# Patient Record
Sex: Female | Born: 1955 | Race: White | Hispanic: No | Marital: Married | Smoking: Never smoker
Health system: Southern US, Community
[De-identification: ages and names within clinical notes are randomized; demographics above are authoritative.]

## PROBLEM LIST (undated history)

## (undated) DIAGNOSIS — Z972 Presence of dental prosthetic device (complete) (partial): Secondary | ICD-10-CM

## (undated) DIAGNOSIS — I4510 Unspecified right bundle-branch block: Secondary | ICD-10-CM

## (undated) DIAGNOSIS — Z853 Personal history of malignant neoplasm of breast: Secondary | ICD-10-CM

## (undated) DIAGNOSIS — E039 Hypothyroidism, unspecified: Secondary | ICD-10-CM

## (undated) DIAGNOSIS — S63056A Dislocation of other carpometacarpal joint of unspecified hand, initial encounter: Secondary | ICD-10-CM

## (undated) HISTORY — PX: BREAST LUMPECTOMY: SHX2

## (undated) HISTORY — PX: CATARACT EXTRACTION W/ INTRAOCULAR LENS  IMPLANT, BILATERAL: SHX1307

## (undated) HISTORY — PX: PTOSIS REPAIR: SHX6568

---

## 2005-03-27 ENCOUNTER — Other Ambulatory Visit: Admission: RE | Admit: 2005-03-27 | Discharge: 2005-03-27 | Payer: Self-pay | Admitting: Family Medicine

## 2015-10-21 DIAGNOSIS — Z170 Estrogen receptor positive status [ER+]: Secondary | ICD-10-CM

## 2015-10-21 DIAGNOSIS — C50411 Malignant neoplasm of upper-outer quadrant of right female breast: Secondary | ICD-10-CM | POA: Insufficient documentation

## 2015-10-21 DIAGNOSIS — Z79810 Long term (current) use of selective estrogen receptor modulators (SERMs): Secondary | ICD-10-CM

## 2015-10-21 DIAGNOSIS — Z5181 Encounter for therapeutic drug level monitoring: Secondary | ICD-10-CM | POA: Insufficient documentation

## 2016-05-06 ENCOUNTER — Emergency Department (HOSPITAL_BASED_OUTPATIENT_CLINIC_OR_DEPARTMENT_OTHER): Payer: BC Managed Care – PPO

## 2016-05-06 ENCOUNTER — Encounter (HOSPITAL_BASED_OUTPATIENT_CLINIC_OR_DEPARTMENT_OTHER): Payer: Self-pay | Admitting: *Deleted

## 2016-05-06 ENCOUNTER — Emergency Department (HOSPITAL_BASED_OUTPATIENT_CLINIC_OR_DEPARTMENT_OTHER)
Admission: EM | Admit: 2016-05-06 | Discharge: 2016-05-06 | Disposition: A | Discharge status: Home / Self Care | Payer: BC Managed Care – PPO | Attending: Emergency Medicine | Admitting: Emergency Medicine

## 2016-05-06 DIAGNOSIS — Y929 Unspecified place or not applicable: Secondary | ICD-10-CM | POA: Insufficient documentation

## 2016-05-06 DIAGNOSIS — Y9301 Activity, walking, marching and hiking: Secondary | ICD-10-CM | POA: Insufficient documentation

## 2016-05-06 DIAGNOSIS — W1839XA Other fall on same level, initial encounter: Secondary | ICD-10-CM | POA: Diagnosis not present

## 2016-05-06 DIAGNOSIS — Y999 Unspecified external cause status: Secondary | ICD-10-CM | POA: Diagnosis not present

## 2016-05-06 DIAGNOSIS — Z79899 Other long term (current) drug therapy: Secondary | ICD-10-CM | POA: Diagnosis not present

## 2016-05-06 DIAGNOSIS — Z7982 Long term (current) use of aspirin: Secondary | ICD-10-CM | POA: Diagnosis not present

## 2016-05-06 DIAGNOSIS — S60221A Contusion of right hand, initial encounter: Secondary | ICD-10-CM | POA: Diagnosis not present

## 2016-05-06 DIAGNOSIS — W19XXXA Unspecified fall, initial encounter: Secondary | ICD-10-CM

## 2016-05-06 DIAGNOSIS — Z23 Encounter for immunization: Secondary | ICD-10-CM | POA: Insufficient documentation

## 2016-05-06 DIAGNOSIS — S8992XA Unspecified injury of left lower leg, initial encounter: Secondary | ICD-10-CM | POA: Diagnosis present

## 2016-05-06 DIAGNOSIS — S82832A Other fracture of upper and lower end of left fibula, initial encounter for closed fracture: Secondary | ICD-10-CM

## 2016-05-06 DIAGNOSIS — S0181XA Laceration without foreign body of other part of head, initial encounter: Secondary | ICD-10-CM | POA: Diagnosis not present

## 2016-05-06 MED ORDER — LIDOCAINE-EPINEPHRINE-TETRACAINE (LET) SOLUTION
3.0000 mL | Freq: Once | NASAL | Status: AC
  Administered 2016-05-06: 3 mL via TOPICAL
  Filled 2016-05-06: qty 3

## 2016-05-06 MED ORDER — TETANUS-DIPHTH-ACELL PERTUSSIS 5-2.5-18.5 LF-MCG/0.5 IM SUSP
0.5000 mL | Freq: Once | INTRAMUSCULAR | Status: AC
  Administered 2016-05-06: 0.5 mL via INTRAMUSCULAR
  Filled 2016-05-06: qty 0.5

## 2016-05-06 MED ORDER — LIDOCAINE HCL (PF) 1 % IJ SOLN
5.0000 mL | Freq: Once | INTRAMUSCULAR | Status: AC
  Administered 2016-05-06: 5 mL
  Filled 2016-05-06: qty 5

## 2016-05-06 NOTE — ED Notes (Signed)
EMT placed gauze with would cleanser on the laceration on the patients head.

## 2016-05-06 NOTE — ED Provider Notes (Signed)
[facility] Provider Note   CSN: [id number] Arrival date & time: 05/06/16  1909  By signing my name below, I, Neta Mends, attest that this documentation has been prepared under the direction and in the presence of Quintella Reichert, MD . Electronically Signed: Neta Mends, ED Scribe. 05/06/2016. 8:54 PM.   History   Chief Complaint Chief Complaint  Patient presents with  . Fall    The history is provided by the patient. No language interpreter was used.   HPI Comments:  [name] [name] is a 60 y.o. female who presents to the Emergency Department s/p fall that occurred PTA. Pt reports that she fell on the concrete on the way to the mailbox. Pt complains of pain to her left ankle and right hand. Pt notes a laceration above her right eye with swelling, and bleeding controlled. Pt states that the laceration was caused by her glasses. No other alleviating factors noted. Pt denies LOC, vomiting, headache. She had no symptoms preceding the fall. Denies any dizziness, shortness of breath, chest pain, nausea. No recent illnesses.  Past Medical History:  Diagnosis Date  . Cancer ([place])     There are no active problems to display for this patient.   Past Surgical History:  Procedure Laterality Date  . BREAST SURGERY    . EYE SURGERY      OB History    No data available       Home Medications    Prior to Admission medications   Medication Sig Start Date End Date Taking? Authorizing Provider  aspirin EC 81 MG tablet Take 81 mg by mouth daily.   Yes Historical Provider, MD  glucosamine-chondroitin 500-400 MG tablet Take 1 tablet by mouth 3 (three) times daily.   Yes Historical Provider, MD  levothyroxine (SYNTHROID, LEVOTHROID) 75 MCG tablet Take 75 mcg by mouth daily before breakfast.   Yes Historical Provider, MD  magnesium oxide (MAG-OX) 400 MG tablet Take 250 mg by mouth daily.   Yes Historical Provider, MD  simvastatin (ZOCOR) 20 MG tablet Take 20 mg by  mouth daily.   Yes Historical Provider, MD  tamoxifen (NOLVADEX) 20 MG tablet Take 20 mg by mouth daily.   Yes Historical Provider, MD    Family History History reviewed. No pertinent family history.  Social History Social History  Substance Use Topics  . Smoking status: Never Smoker  . Smokeless tobacco: Never Used  . Alcohol use No     Allergies   Patient has no known allergies.   Review of Systems Review of Systems  HENT: Positive for facial swelling.   Gastrointestinal: Negative for vomiting.  Musculoskeletal: Positive for arthralgias and joint swelling.  Skin: Positive for wound.  Neurological: Negative for syncope and headaches.  All other systems reviewed and are negative.    Physical Exam Updated Vital Signs BP 120/66 (BP Location: Left Arm) Comment: Simultaneous filing. User may not have seen previous data.  Pulse 87 Comment: Simultaneous filing. User may not have seen previous data.  Temp 98 F (36.7 C) (Oral)   Resp 18   Ht 5\' 4"  (1.626 m)   Wt 185 lb (83.9 kg)   SpO2 95% Comment: Simultaneous filing. User may not have seen previous data.  BMI 31.76 kg/m   Physical Exam  Constitutional: She is oriented to person, place, and time. She appears well-developed and well-nourished.  HENT:  Head: Normocephalic.  3 cm irregular laceration to the right temple with mild local tenderness and ecchymosis  Eyes: EOM are normal. Pupils are equal, round, and reactive to light.  Neck: Normal range of motion. Neck supple.  No C-spine tenderness  Cardiovascular: Normal rate and regular rhythm.   No murmur heard. Pulmonary/Chest: Effort normal and breath sounds normal. No respiratory distress.  Abdominal: Soft. There is no tenderness. There is no rebound and no guarding.  Musculoskeletal:  Ecchymosis and tenderness to right dorsolateral hand with range of motion intact throughout the digits. There is mild tenderness to the left lateral malleolus. 2+ radial pulses  bilaterally. 2+ DP pulses bilaterally.  Neurological: She is alert and oriented to person, place, and time.  Skin: Skin is warm and dry.  Psychiatric: She has a normal mood and affect. Her behavior is normal.  Nursing note and vitals reviewed.    ED Treatments / Results  DIAGNOSTIC STUDIES:  Oxygen Saturation is 99% on RA, normal by my interpretation.    COORDINATION OF CARE:  8:54 PM Discussed treatment plan with pt at bedside and pt agreed to plan.   Labs (all labs ordered are listed, but only abnormal results are displayed) Labs Reviewed - No data to display  EKG  EKG Interpretation None       Radiology Dg Ankle Complete Left  Result Date: 05/06/2016 CLINICAL DATA:  Patient fell in her driveway, walking to mailbox. Left lat ankle pain. Right hand pain, esp 4th and 5th metacarpal areas. Abrasion to knuckles, bruising to 4th metacarpal area. EXAM: LEFT ANKLE COMPLETE - 3+ VIEW COMPARISON:  None. FINDINGS: There is a nondisplaced, non comminuted fracture across the distal fibula, approximately 14 mm below the ankle joint. No other fractures. The ankle mortise is normally spaced and aligned. There is soft tissue swelling which predominates laterally. A moderate-sized plantar calcaneal spur is noted. IMPRESSION: Nondisplaced fracture of the distal fibula.  No dislocation. Electronically Signed   By: Lajean Manes M.D.   On: 05/06/2016 20:34   Dg Hand Complete Right  Result Date: 05/06/2016 CLINICAL DATA:  Patient fell in her driveway, walking to mailbox. Left lat ankle pain. Right hand pain, esp 4th and 5th metacarpal areas. Abrasion to knuckles, bruising to 4th metacarpal area. EXAM: RIGHT HAND - COMPLETE 3+ VIEW COMPARISON:  None. FINDINGS: No fracture.  The joints are normally aligned. Soft tissues are unremarkable. IMPRESSION: No fracture or dislocation. Electronically Signed   By: Lajean Manes M.D.   On: 05/06/2016 20:35    Procedures .Marland KitchenLaceration Repair Date/Time:  05/06/2016 11:47 PM Performed by: Quintella Reichert Authorized by: Quintella Reichert   Consent:    Consent obtained:  Verbal   Consent given by:  Patient   Risks discussed:  Infection, pain, poor cosmetic result and poor wound healing Anesthesia (see MAR for exact dosages):    Anesthesia method:  Topical application and local infiltration   Topical anesthetic:  LET   Local anesthetic:  Lidocaine 1% w/o epi Laceration details:    Location:  Face   Face location:  Forehead   Length (cm):  3 Repair type:    Repair type:  Simple Pre-procedure details:    Preparation:  Patient was prepped and draped in usual sterile fashion Exploration:    Contaminated: no   Treatment:    Area cleansed with:  Betadine and saline   Amount of cleaning:  Standard   Visualized foreign bodies/material removed: no   Skin repair:    Repair method:  Sutures   Suture size:  5-0   Suture material:  Prolene   Suture technique:  Simple interrupted   Number of sutures:  4 Approximation:    Approximation:  Close   Vermilion border: well-aligned   Post-procedure details:    Dressing:  Antibiotic ointment   Patient tolerance of procedure:  Tolerated well, no immediate complications   Medications Ordered in ED Medications  lidocaine-EPINEPHrine-tetracaine (LET) solution (3 mLs Topical Given 05/06/16 2024)  Tdap (BOOSTRIX) injection 0.5 mL (0.5 mLs Intramuscular Given 05/06/16 2036)  lidocaine (PF) (XYLOCAINE) 1 % injection 5 mL (5 mLs Infiltration Given by Other 05/06/16 2033)     Initial Impression / Assessment and Plan / ED Course  I have reviewed the triage vital signs and the nursing notes.  Pertinent labs & imaging results that were available during my care of the patient were reviewed by me and considered in my medical decision making (see chart for details).  Clinical Course     Patient here for evaluation of injuries following a mechanical fall. Her wound to her face was repaired per procedure note.  Discussed local wound care as well as return precautions for any evidence of developing infection. Head imaging was deferred Canadian head CT criteria. Discussed head injury return precautions.  Forehead contusion there is no evidence of acute fracture and she is neurovascularly intact. Given her tenderness with movement will place in the splint that she can remove as needed. She has a mild nondisplaced left fibular fracture, we will place a walking boot with outpatient orthopedic follow-up.  Final Clinical Impressions(s) / ED Diagnoses   Final diagnoses:  Fall, initial encounter  Facial laceration, initial encounter  Contusion of right hand, initial encounter  Closed avulsion fracture of distal end of left fibula, initial encounter    New Prescriptions Discharge Medication List as of 05/06/2016  9:43 PM    I personally performed the services described in this documentation, which was scribed in my presence. The recorded information has been reviewed and is accurate.     Quintella Reichert, MD 05/06/16 2350

## 2016-05-06 NOTE — ED Triage Notes (Signed)
Pt reports falling when walking to mailbox.  Denies loc.  Laceration above right eye with swelling, bleeding controlled.  Left ankle bruising and swelling, right hand bruising, swelling and abrasion with bleeding controlled.  Denies change in vision.

## 2016-05-06 NOTE — ED Notes (Signed)
C/o R hand, L ankle swelling, bruising and pain, and R forehead lac, swelling and pain. (denies: LOC, head, neck and back pain, nv, dizziness, visual changes, nasal or dental issues), h/o bilateral eye surgeries, PERRLA 85mm brisk, advil taken PTA.

## 2016-05-13 ENCOUNTER — Encounter (HOSPITAL_BASED_OUTPATIENT_CLINIC_OR_DEPARTMENT_OTHER): Payer: Self-pay | Admitting: *Deleted

## 2016-05-13 ENCOUNTER — Emergency Department (HOSPITAL_BASED_OUTPATIENT_CLINIC_OR_DEPARTMENT_OTHER)
Admission: EM | Admit: 2016-05-13 | Discharge: 2016-05-13 | Disposition: A | Discharge status: Home / Self Care | Payer: BC Managed Care – PPO | Attending: Emergency Medicine | Admitting: Emergency Medicine

## 2016-05-13 DIAGNOSIS — Z7982 Long term (current) use of aspirin: Secondary | ICD-10-CM | POA: Insufficient documentation

## 2016-05-13 DIAGNOSIS — Z859 Personal history of malignant neoplasm, unspecified: Secondary | ICD-10-CM | POA: Diagnosis not present

## 2016-05-13 DIAGNOSIS — Z79899 Other long term (current) drug therapy: Secondary | ICD-10-CM | POA: Diagnosis not present

## 2016-05-13 DIAGNOSIS — Z4802 Encounter for removal of sutures: Secondary | ICD-10-CM | POA: Diagnosis present

## 2016-05-13 NOTE — ED Provider Notes (Signed)
[facility] Provider Note   CSN: [id number] Arrival date & time: 05/13/16  0908     History   Chief Complaint Chief Complaint  Patient presents with  . Suture / Staple Removal    HPI [name] [name] is a 60 y.o. female.  HPI   60 year old female presents today for suture removal. Patient was seen a week ago after a fall with a laceration to the right forehead. She had 4 sutures placed, she reports cleaning wound no signs of infection, no other complaints here today.  Past Medical History:  Diagnosis Date  . Cancer ([place])     There are no active problems to display for this patient.   Past Surgical History:  Procedure Laterality Date  . BREAST SURGERY    . EYE SURGERY      OB History    No data available       Home Medications    Prior to Admission medications   Medication Sig Start Date End Date Taking? Authorizing Provider  aspirin EC 81 MG tablet Take 81 mg by mouth daily.   Yes Historical Provider, MD  glucosamine-chondroitin 500-400 MG tablet Take 1 tablet by mouth 3 (three) times daily.   Yes Historical Provider, MD  levothyroxine (SYNTHROID, LEVOTHROID) 75 MCG tablet Take 75 mcg by mouth daily before breakfast.   Yes Historical Provider, MD  magnesium oxide (MAG-OX) 400 MG tablet Take 250 mg by mouth daily.   Yes Historical Provider, MD  simvastatin (ZOCOR) 20 MG tablet Take 20 mg by mouth daily.   Yes Historical Provider, MD  tamoxifen (NOLVADEX) 20 MG tablet Take 20 mg by mouth daily.   Yes Historical Provider, MD    Family History No family history on file.  Social History Social History  Substance Use Topics  . Smoking status: Never Smoker  . Smokeless tobacco: Never Used  . Alcohol use No     Allergies   Patient has no known allergies.   Review of Systems Review of Systems  All other systems reviewed and are negative.    Physical Exam Updated Vital Signs BP 128/92 (BP Location: Left Arm)   Pulse 82   Temp 98.1 F  (36.7 C) (Oral)   Resp 20   Ht 5\' 4"  (1.626 m)   Wt 83.9 kg   SpO2 100%   BMI 31.76 kg/m   Physical Exam  Constitutional: She is oriented to person, place, and time. She appears well-developed and well-nourished.  HENT:  Head: Normocephalic.  Well approximated laceration to the right forehead. No signs of infection, no surrounding tenderness small amount of bruising still noted  Eyes: Conjunctivae are normal. Pupils are equal, round, and reactive to light. Right eye exhibits no discharge. Left eye exhibits no discharge. No scleral icterus.  Neck: Normal range of motion. No JVD present. No tracheal deviation present.  Pulmonary/Chest: Effort normal. No stridor.  Neurological: She is alert and oriented to person, place, and time. Coordination normal.  Psychiatric: She has a normal mood and affect. Her behavior is normal. Judgment and thought content normal.  Nursing note and vitals reviewed.    ED Treatments / Results  Labs (all labs ordered are listed, but only abnormal results are displayed) Labs Reviewed - No data to display  EKG  EKG Interpretation None       Radiology No results found.  Procedures Procedures (including critical care time)  Medications Ordered in ED Medications - No data to display   Initial Impression /  Assessment and Plan / ED Course  I have reviewed the triage vital signs and the nursing notes.  Pertinent labs & imaging results that were available during my care of the patient were reviewed by me and considered in my medical decision making (see chart for details).  Clinical Course      Final Clinical Impressions(s) / ED Diagnoses   Final diagnoses:  Visit for suture removal    Labs:  Imaging:  Consults:  Therapeutics:  Discharge Meds:   Assessment/Plan:  Patient presents for suture removal. No signs of infection, sutures removed without complication, vision following up with orthopedics for musculoskeletal  injuries.      New Prescriptions New Prescriptions   No medications on file     Okey Regal, PA-C 05/13/16 [id number]    Veryl Speak, MD 05/13/16 1340

## 2016-05-13 NOTE — Discharge Instructions (Signed)
Please read attached information. If you experience any new or worsening signs or symptoms please return to the emergency room for evaluation. Please follow-up with your primary care provider or specialist as discussed.  °

## 2016-05-13 NOTE — ED Triage Notes (Signed)
Pt reports getting sutures here 1 wk ago after a fall. Pt has sutures above R eyebrow. Pt denies fever, redness, drainage from site. Denies concern about other current injuries (reports upcoming appt with Dr. Barbaraann Barthel); has ortho devices on R wrist and L foot/leg.

## 2016-05-15 ENCOUNTER — Ambulatory Visit (HOSPITAL_BASED_OUTPATIENT_CLINIC_OR_DEPARTMENT_OTHER)
Admission: RE | Admit: 2016-05-15 | Discharge: 2016-05-15 | Disposition: A | Discharge status: Home / Self Care | Payer: BC Managed Care – PPO | Source: Ambulatory Visit | Attending: Family Medicine | Admitting: Family Medicine

## 2016-05-15 ENCOUNTER — Ambulatory Visit (INDEPENDENT_AMBULATORY_CARE_PROVIDER_SITE_OTHER): Payer: BC Managed Care – PPO | Admitting: Family Medicine

## 2016-05-15 ENCOUNTER — Encounter: Payer: Self-pay | Admitting: Family Medicine

## 2016-05-15 VITALS — BP 119/81 | HR 87 | Ht 64.0 in | Wt 186.0 lb

## 2016-05-15 DIAGNOSIS — S6991XA Unspecified injury of right wrist, hand and finger(s), initial encounter: Secondary | ICD-10-CM

## 2016-05-15 DIAGNOSIS — M79641 Pain in right hand: Secondary | ICD-10-CM | POA: Insufficient documentation

## 2016-05-15 DIAGNOSIS — S99912A Unspecified injury of left ankle, initial encounter: Secondary | ICD-10-CM

## 2016-05-15 NOTE — Patient Instructions (Signed)
You have a distal fibula fracture. Ice the area for 15 minutes at a time, 3-4 times a day Aleve or ibuprofen if needed for pain and inflammation. Elevate above the level of your heart when possible Use boot when up and walking around but when you feel comfortable you can switch to a supportive shoe. Come out of the boot/brace twice a day to do Up/down and alphabet exercises 2-3 sets of each. Consider physical therapy for strengthening and balance exercises in the future Follow up with me in 3 weeks for reevaluation.  You have a hand sprain and contusion. Ice the area 15 minutes at a time 3-4 times a day. Use the wrist brace for comfort.  Buddy taping is an option also. Medicine as noted above.

## 2016-05-17 DIAGNOSIS — S99912D Unspecified injury of left ankle, subsequent encounter: Secondary | ICD-10-CM | POA: Insufficient documentation

## 2016-05-17 DIAGNOSIS — S6991XD Unspecified injury of right wrist, hand and finger(s), subsequent encounter: Secondary | ICD-10-CM | POA: Insufficient documentation

## 2016-05-17 NOTE — Assessment & Plan Note (Signed)
independently reviewed radiographs showing distal fibula fracture below level of ankle joint.  Should heal well over 6 weeks.  Cam walker when up and walking around.  Icing, elevation, aleve or ibuprofen.  F/u in 3 weeks.

## 2016-05-17 NOTE — Progress Notes (Signed)
PCP: Glendon Axe, MD  Subjective:   HPI: Patient is a 60 y.o. female here for left ankle, right wrist injuries.  Patient reports on 12/2 she was walking to the mailbox, stepped on a rock and inverted left ankle, fell down. Landed somehow onto right hand and wrist but unsure how. Having 3/10 pain in both areas but 5/10 with walking, sharp. She is wearing a cam walker. + swelling and bruising both areas. No prior injuries. No skin changes, numbness otherwise.  Past Medical History:  Diagnosis Date  . Cancer [facility])     Current Outpatient Prescriptions on File Prior to Visit  Medication Sig Dispense Refill  . aspirin EC 81 MG tablet Take 81 mg by mouth daily.    Marland Kitchen glucosamine-chondroitin 500-400 MG tablet Take 1 tablet by mouth 3 (three) times daily.    Marland Kitchen levothyroxine (SYNTHROID, LEVOTHROID) 75 MCG tablet Take 75 mcg by mouth daily before breakfast.    . magnesium oxide (MAG-OX) 400 MG tablet Take 250 mg by mouth daily.    . simvastatin (ZOCOR) 20 MG tablet Take 20 mg by mouth daily.    . tamoxifen (NOLVADEX) 20 MG tablet Take 20 mg by mouth daily.     No current facility-administered medications on file prior to visit.     Past Surgical History:  Procedure Laterality Date  . BREAST SURGERY    . EYE SURGERY      No Known Allergies  Social History   Social History  . Marital status: Married    Spouse name: N/A  . Number of children: N/A  . Years of education: N/A   Occupational History  . Not on file.   Social History Main Topics  . Smoking status: Never Smoker  . Smokeless tobacco: Never Used  . Alcohol use No  . Drug use: No  . Sexual activity: Not on file   Other Topics Concern  . Not on file   Social History Narrative  . No narrative on file    No family history on file.  BP 119/81   Pulse 87   Ht 5\' 4"  (1.626 m)   Wt 186 lb (84.4 kg)   BMI 31.93 kg/m   Review of Systems: See HPI above.     Objective:  Physical Exam:  Gen: NAD,  comfortable in exam room  Left ankle: Mild swelling, bruising lateral ankle.  No other deformity. Mild limitation ROM all directions. TTP distal fibula.  No other tenderness of ankle or fibular head. Thompsons test negative. NV intact distally.  Right wrist: Mild bruising and swelling hypothenar area.   TTP over hypothenar area, 5th metacarpal.  No other tenderness including snuffbox. FROM including digits.  Strength 5/5 at wrist, MCP, DIP, PIP joints. NVI distally.  Assessment & Plan:  1. Left ankle injury - independently reviewed radiographs showing distal fibula fracture below level of ankle joint.  Should heal well over 6 weeks.  Cam walker when up and walking around.  Icing, elevation, aleve or ibuprofen.  F/u in 3 weeks.  2. Right hand injury - independently reviewed repeat radiographs and no evidence occult fracture of 5th metacarpal in area of pain.  2/2 contusion and sprain.  Icing, wrist brace for comfort.  Consider buddy taping 4th and 5th digits.  Ibuprofen or aleve.  F/u in 3 weeks.

## 2016-05-17 NOTE — Assessment & Plan Note (Signed)
independently reviewed repeat radiographs and no evidence occult fracture of 5th metacarpal in area of pain.  2/2 contusion and sprain.  Icing, wrist brace for comfort.  Consider buddy taping 4th and 5th digits.  Ibuprofen or aleve.  F/u in 3 weeks.

## 2016-06-06 ENCOUNTER — Encounter: Payer: Self-pay | Admitting: Family Medicine

## 2016-06-06 ENCOUNTER — Ambulatory Visit (INDEPENDENT_AMBULATORY_CARE_PROVIDER_SITE_OTHER): Payer: BC Managed Care – PPO | Admitting: Family Medicine

## 2016-06-06 DIAGNOSIS — S99912D Unspecified injury of left ankle, subsequent encounter: Secondary | ICD-10-CM | POA: Diagnosis not present

## 2016-06-06 DIAGNOSIS — S6991XD Unspecified injury of right wrist, hand and finger(s), subsequent encounter: Secondary | ICD-10-CM | POA: Diagnosis not present

## 2016-06-06 NOTE — Patient Instructions (Signed)
You have a distal fibula fracture. Continue the boot until 1/13 then switch to a supportive shoe when up and walking around (no flat shoes, flip-flops, barefoot walking for a couple weeks beyond that). Ice or heat as needed at this point. Aleve or ibuprofen if needed for pain and inflammation. On 1/13 you can start the theraband strengthening exercises for your ankle too. Come out of the boot/brace twice a day to do Up/down and alphabet exercises 2-3 sets of each. Consider physical therapy for strengthening and balance exercises in the future Follow up with me in 4 weeks for reevaluation.  Your hand and wrist exams are reassuring - still this small hematoma and contusion.

## 2016-06-07 NOTE — Assessment & Plan Note (Signed)
Radiographs were negative for fracture.  Still with swelling in hypothenar area - consistent with contusion, hematoma.  Reassured.  Icing, wrist brace for comfort only if needed.  Buddy taping helps as well.  Ibuprofen or aleve if needed.

## 2016-06-07 NOTE — Progress Notes (Signed)
PCP: Glendon Axe, MD  Subjective:   HPI: Patient is a 61 y.o. female here for left ankle, right wrist injuries.  12/11: Patient reports on 12/2 she was walking to the mailbox, stepped on a rock and inverted left ankle, fell down. Landed somehow onto right hand and wrist but unsure how. Having 3/10 pain in both areas but 5/10 with walking, sharp. She is wearing a cam walker. + swelling and bruising both areas. No prior injuries. No skin changes, numbness otherwise.  06/06/16: Patient reports she is improving. Left ankle pain is 3/10 at worst now and she is using cam walker. Right hand still painful on ulnar side at 4/10 level - swelling improving. Also noticed some volar bilateral wrist pain that is mild. No skin changes, numbness.  Past Medical History:  Diagnosis Date  . Cancer [facility])     Current Outpatient Prescriptions on File Prior to Visit  Medication Sig Dispense Refill  . aspirin EC 81 MG tablet Take 81 mg by mouth daily.    Marland Kitchen glucosamine-chondroitin 500-400 MG tablet Take 1 tablet by mouth 3 (three) times daily.    Marland Kitchen levothyroxine (SYNTHROID, LEVOTHROID) 75 MCG tablet Take 75 mcg by mouth daily before breakfast.    . magnesium oxide (MAG-OX) 400 MG tablet Take 250 mg by mouth daily.    . simvastatin (ZOCOR) 20 MG tablet Take 20 mg by mouth daily.    . tamoxifen (NOLVADEX) 20 MG tablet Take 20 mg by mouth daily.     No current facility-administered medications on file prior to visit.     Past Surgical History:  Procedure Laterality Date  . BREAST SURGERY    . EYE SURGERY      No Known Allergies  Social History   Social History  . Marital status: Married    Spouse name: N/A  . Number of children: N/A  . Years of education: N/A   Occupational History  . Not on file.   Social History Main Topics  . Smoking status: Never Smoker  . Smokeless tobacco: Never Used  . Alcohol use No  . Drug use: No  . Sexual activity: Not on file   Other Topics Concern   . Not on file   Social History Narrative  . No narrative on file    No family history on file.  BP 111/78   Pulse 87   Ht 5\' 4"  (1.626 m)   Wt 185 lb (83.9 kg)   BMI 31.76 kg/m   Review of Systems: See HPI above.     Objective:  Physical Exam:  Gen: NAD, comfortable in exam room  Left ankle: Mild swelling, no bruising lateral ankle.  No other deformity. Mild limitation ROM all directions. Mild TTP distal fibula.  No other tenderness of ankle or fibular head. Thompsons test negative. NV intact distally.  Right wrist: Mild swelling hypothenar area.  Bruising resolved. TTP over hypothenar area, minimal over 5th metacarpal now.  No other tenderness including snuffbox. FROM including digits.  Strength 5/5 at wrist, MCP, DIP, PIP joints. NVI distally.   Assessment & Plan:  1. Left ankle injury - distal fibula fracture below level of ankle joint.  She will continue with cam walker then switch to a supportive shoe.  Ice/heat if needed.  Aleve or ibuprofen if needed.  F/u in 4 weeks.  Shown home exercises to do as well - start strengthening when 6 weeks out from fracture.  2. Right hand injury - Radiographs were negative for  fracture.  Still with swelling in hypothenar area - consistent with contusion, hematoma.  Reassured.  Icing, wrist brace for comfort only if needed.  Buddy taping helps as well.  Ibuprofen or aleve if needed.

## 2016-06-07 NOTE — Assessment & Plan Note (Signed)
distal fibula fracture below level of ankle joint.  She will continue with cam walker then switch to a supportive shoe.  Ice/heat if needed.  Aleve or ibuprofen if needed.  F/u in 4 weeks.  Shown home exercises to do as well - start strengthening when 6 weeks out from fracture.

## 2016-07-04 ENCOUNTER — Encounter: Payer: Self-pay | Admitting: Family Medicine

## 2016-07-04 ENCOUNTER — Ambulatory Visit (INDEPENDENT_AMBULATORY_CARE_PROVIDER_SITE_OTHER): Payer: BC Managed Care – PPO | Admitting: Family Medicine

## 2016-07-04 DIAGNOSIS — S99912D Unspecified injury of left ankle, subsequent encounter: Secondary | ICD-10-CM

## 2016-07-04 DIAGNOSIS — S6991XD Unspecified injury of right wrist, hand and finger(s), subsequent encounter: Secondary | ICD-10-CM | POA: Diagnosis not present

## 2016-07-04 NOTE — Patient Instructions (Signed)
We will go ahead with an MRI of your hand with attention to the 5th metacarpal, finger areas. I will contact you the business day following this to go over results. Continue current treatment for your ankle. If still having problems at 3 months out we will repeat x-rays and exam, go from there.

## 2016-07-05 NOTE — Assessment & Plan Note (Signed)
Radiographs x2 were negative for fracture.  Exam has been reassuring but now has some abduction of 5th digit.  Given this has been 8 weeks and still with pain, developing deformity despite reassuring exam otherwise, will go ahead with MRI to assess for tendon injury.

## 2016-07-05 NOTE — Assessment & Plan Note (Signed)
distal fibula fracture below level of ankle joint.  Very slow improvement but expect this to completely improve over next 4 weeks - discussed if still having problems would repeat radiographs, consider possible nonunion and ortho referral though this is rare for this type of fracture.  Aleve or ibuprofen if needed.  F/u in 4 weeks.  Continue home exercises.

## 2016-07-05 NOTE — Progress Notes (Signed)
PCP: Glendon Axe, MD  Subjective:   HPI: Patient is a 61 y.o. female here for left ankle, right wrist injuries.  12/11: Patient reports on 12/2 she was walking to the mailbox, stepped on a rock and inverted left ankle, fell down. Landed somehow onto right hand and wrist but unsure how. Having 3/10 pain in both areas but 5/10 with walking, sharp. She is wearing a cam walker. + swelling and bruising both areas. No prior injuries. No skin changes, numbness otherwise.  06/06/16: Patient reports she is improving. Left ankle pain is 3/10 at worst now and she is using cam walker. Right hand still painful on ulnar side at 4/10 level - swelling improving. Also noticed some volar bilateral wrist pain that is mild. No skin changes, numbness.  1/30: Patient reports she still gets some soreness lateral left ankle around 1-3/10 but able to walk without a boot. Pain is a soreness worse by end of day with a lot of standing and walking. Some mild swelling. No skin changes, numbness. Her right wrist still with some swelling, spasms, soreness. Unable to fully flex 5th digit. Associated swelling in this finger and ulnar side of hand. No skin changes, numbness.  Past Medical History:  Diagnosis Date  . Cancer [facility])     Current Outpatient Prescriptions on File Prior to Visit  Medication Sig Dispense Refill  . aspirin EC 81 MG tablet Take 81 mg by mouth daily.    Marland Kitchen glucosamine-chondroitin 500-400 MG tablet Take 1 tablet by mouth 3 (three) times daily.    Marland Kitchen levothyroxine (SYNTHROID, LEVOTHROID) 75 MCG tablet Take 75 mcg by mouth daily before breakfast.    . magnesium oxide (MAG-OX) 400 MG tablet Take 250 mg by mouth daily.    . simvastatin (ZOCOR) 20 MG tablet Take 20 mg by mouth daily.    . tamoxifen (NOLVADEX) 20 MG tablet Take 20 mg by mouth daily.     No current facility-administered medications on file prior to visit.     Past Surgical History:  Procedure Laterality Date  . BREAST  SURGERY    . EYE SURGERY      No Known Allergies  Social History   Social History  . Marital status: Married    Spouse name: N/A  . Number of children: N/A  . Years of education: N/A   Occupational History  . Not on file.   Social History Main Topics  . Smoking status: Never Smoker  . Smokeless tobacco: Never Used  . Alcohol use No  . Drug use: No  . Sexual activity: Not on file   Other Topics Concern  . Not on file   Social History Narrative  . No narrative on file    No family history on file.  BP 121/86   Pulse 72   Ht 5\' 4"  (1.626 m)   Wt 185 lb (83.9 kg)   BMI 31.76 kg/m   Review of Systems: See HPI above.     Objective:  Physical Exam:  Gen: NAD, comfortable in exam room  Left ankle: Mild swelling, no bruising lateral ankle.  No other deformity. Mild limitation ROM all directions. Mild TTP distal fibula.  No other tenderness of ankle or fibular head. Thompsons test negative. NV intact distally.  Right wrist: Mild swelling hypothenar area and into 4th, 5th digits.  On flexion 5th digit more abducted.  No malrotation. TTP over hypothenar area, 5th digit proximally.  No other hand or wrist tenderness including snuffbox. Strength 5/5  at wrist, MCP, DIP, PIP joints. Collateral ligaments intact. NVI distally.   Assessment & Plan:  1. Left ankle injury - distal fibula fracture below level of ankle joint.  Very slow improvement but expect this to completely improve over next 4 weeks - discussed if still having problems would repeat radiographs, consider possible nonunion and ortho referral though this is rare for this type of fracture.  Aleve or ibuprofen if needed.  F/u in 4 weeks.  Continue home exercises.  2. Right hand injury - Radiographs x2 were negative for fracture.  Exam has been reassuring but now has some abduction of 5th digit.  Given this has been 8 weeks and still with pain, developing deformity despite reassuring exam otherwise, will go  ahead with MRI to assess for tendon injury.

## 2016-07-12 ENCOUNTER — Telehealth: Payer: Self-pay | Admitting: Family Medicine

## 2016-07-12 NOTE — Telephone Encounter (Signed)
They would not do the MRI because they are saying that she was lacking information on the order regarding prior x-rays and prior viisits here.  Please call her at 3:15 because she cannot talk until then, thanks.

## 2016-07-12 NOTE — Telephone Encounter (Signed)
Please let her know they've denied it initially despite being informed about information, exam.  I have to speak to physician at insurance company to try to get it approved.

## 2016-07-13 NOTE — Telephone Encounter (Signed)
Spoke to patient and told her that we would have to [employer] and do a peer to peer.

## 2016-07-14 ENCOUNTER — Telehealth: Payer: Self-pay | Admitting: Family Medicine

## 2016-07-14 NOTE — Telephone Encounter (Signed)
I spoke with them and it has been denied - I left a message with appeals department with detailed clinical information.

## 2016-07-14 NOTE — Telephone Encounter (Signed)
[name] [name] at phone number [phone] ext. [phone] left voicemail letting us know that the request for MRI of patient's hand is denied by [facility] insurance because they do not allow new request within 60 days of denial (?)  If you would like to appeal the decision, please send supporting documents to help her case to fax # [phone] Attn: Appeals w/her DOB and name.  No Peer to Peer is necessary.  She stating something about sending (?) by 14th (I didn't catch all of message)  You may call her directly with questions, thank you.

## 2016-07-14 NOTE — Telephone Encounter (Signed)
I'm aware.  My message for her was giving clinical information for appeal.  We can send notes in as well.  It wasn't a message to start a new request.

## 2016-09-03 DIAGNOSIS — S63056A Dislocation of other carpometacarpal joint of unspecified hand, initial encounter: Secondary | ICD-10-CM

## 2016-09-03 HISTORY — DX: Dislocation of other carpometacarpal joint of unspecified hand, initial encounter: S63.056A

## 2016-09-13 ENCOUNTER — Other Ambulatory Visit: Payer: Self-pay | Admitting: Orthopedic Surgery

## 2016-09-14 ENCOUNTER — Encounter (HOSPITAL_BASED_OUTPATIENT_CLINIC_OR_DEPARTMENT_OTHER): Payer: Self-pay | Admitting: *Deleted

## 2016-09-14 NOTE — Pre-Procedure Instructions (Signed)
EKG and echo requested from [facility].

## 2016-09-15 NOTE — Pre-Procedure Instructions (Addendum)
History and 2016 EKG reviewed with Dr. Ermalene Postin; repeat EKG prior to surgery.  Pt. notified to come for EKG.

## 2016-09-19 ENCOUNTER — Other Ambulatory Visit: Payer: Self-pay

## 2016-09-19 ENCOUNTER — Encounter (HOSPITAL_BASED_OUTPATIENT_CLINIC_OR_DEPARTMENT_OTHER)
Admission: RE | Admit: 2016-09-19 | Discharge: 2016-09-19 | Disposition: A | Discharge status: Home / Self Care | Payer: BC Managed Care – PPO | Source: Ambulatory Visit | Attending: Orthopedic Surgery | Admitting: Orthopedic Surgery

## 2016-09-19 DIAGNOSIS — I4510 Unspecified right bundle-branch block: Secondary | ICD-10-CM | POA: Diagnosis not present

## 2016-09-19 DIAGNOSIS — W19XXXA Unspecified fall, initial encounter: Secondary | ICD-10-CM | POA: Diagnosis not present

## 2016-09-19 DIAGNOSIS — S62606A Fracture of unspecified phalanx of right little finger, initial encounter for closed fracture: Secondary | ICD-10-CM | POA: Diagnosis not present

## 2016-09-19 DIAGNOSIS — M79641 Pain in right hand: Secondary | ICD-10-CM | POA: Diagnosis present

## 2016-09-19 NOTE — Progress Notes (Signed)
Pre-op EKG reviewed per Dr Lissa Hoard. OK to continue with surgery sx 09/21/2016

## 2016-09-21 ENCOUNTER — Ambulatory Visit (HOSPITAL_BASED_OUTPATIENT_CLINIC_OR_DEPARTMENT_OTHER): Payer: BC Managed Care – PPO | Admitting: Anesthesiology

## 2016-09-21 ENCOUNTER — Ambulatory Visit (HOSPITAL_BASED_OUTPATIENT_CLINIC_OR_DEPARTMENT_OTHER)
Admission: RE | Admit: 2016-09-21 | Discharge: 2016-09-21 | Disposition: A | Discharge status: Home / Self Care | Payer: BC Managed Care – PPO | Source: Ambulatory Visit | Attending: Orthopedic Surgery | Admitting: Orthopedic Surgery

## 2016-09-21 ENCOUNTER — Encounter (HOSPITAL_BASED_OUTPATIENT_CLINIC_OR_DEPARTMENT_OTHER)
Admission: RE | Disposition: A | Discharge status: Home / Self Care | Payer: Self-pay | Source: Ambulatory Visit | Attending: Orthopedic Surgery

## 2016-09-21 ENCOUNTER — Encounter (HOSPITAL_BASED_OUTPATIENT_CLINIC_OR_DEPARTMENT_OTHER): Payer: Self-pay

## 2016-09-21 DIAGNOSIS — I4510 Unspecified right bundle-branch block: Secondary | ICD-10-CM | POA: Insufficient documentation

## 2016-09-21 DIAGNOSIS — S62606A Fracture of unspecified phalanx of right little finger, initial encounter for closed fracture: Secondary | ICD-10-CM | POA: Insufficient documentation

## 2016-09-21 DIAGNOSIS — W19XXXA Unspecified fall, initial encounter: Secondary | ICD-10-CM | POA: Insufficient documentation

## 2016-09-21 HISTORY — DX: Unspecified right bundle-branch block: I45.10

## 2016-09-21 HISTORY — PX: CARPOMETACARPEL (CMC) FUSION OF THUMB WITH AUTOGRAFT FROM RADIUS: SHX5767

## 2016-09-21 HISTORY — DX: Dislocation of other carpometacarpal joint of unspecified hand, initial encounter: S63.056A

## 2016-09-21 HISTORY — DX: Hypothyroidism, unspecified: E03.9

## 2016-09-21 HISTORY — DX: Presence of dental prosthetic device (complete) (partial): Z97.2

## 2016-09-21 HISTORY — DX: Personal history of malignant neoplasm of breast: Z85.3

## 2016-09-21 SURGERY — CARPOMETACARPEL (CMC) FUSION OF THUMB WITH AUTOGRAFT FROM RADIUS
Anesthesia: General | Site: Finger | Laterality: Right

## 2016-09-21 MED ORDER — CEFAZOLIN SODIUM-DEXTROSE 2-4 GM/100ML-% IV SOLN
2.0000 g | INTRAVENOUS | Status: AC
  Administered 2016-09-21: 2 g via INTRAVENOUS

## 2016-09-21 MED ORDER — METOCLOPRAMIDE HCL 5 MG/ML IJ SOLN: 10.0000 mg | Freq: Once | INTRAMUSCULAR | Status: DC | PRN

## 2016-09-21 MED ORDER — FENTANYL CITRATE (PF) 100 MCG/2ML IJ SOLN
INTRAMUSCULAR | Status: AC
  Filled 2016-09-21: qty 2

## 2016-09-21 MED ORDER — MEPERIDINE HCL 25 MG/ML IJ SOLN: 6.2500 mg | INTRAMUSCULAR | Status: DC | PRN

## 2016-09-21 MED ORDER — LIDOCAINE 2% (20 MG/ML) 5 ML SYRINGE
INTRAMUSCULAR | Status: DC | PRN
  Administered 2016-09-21: 50 mg via INTRAVENOUS

## 2016-09-21 MED ORDER — MIDAZOLAM HCL 2 MG/2ML IJ SOLN
1.0000 mg | INTRAMUSCULAR | Status: DC | PRN
  Administered 2016-09-21: 2 mg via INTRAVENOUS

## 2016-09-21 MED ORDER — DEXAMETHASONE SODIUM PHOSPHATE 10 MG/ML IJ SOLN
INTRAMUSCULAR | Status: AC
  Filled 2016-09-21: qty 1

## 2016-09-21 MED ORDER — PROPOFOL 10 MG/ML IV BOLUS
INTRAVENOUS | Status: DC | PRN
  Administered 2016-09-21: 150 mg via INTRAVENOUS

## 2016-09-21 MED ORDER — MIDAZOLAM HCL 2 MG/2ML IJ SOLN
INTRAMUSCULAR | Status: AC
  Filled 2016-09-21: qty 2

## 2016-09-21 MED ORDER — ONDANSETRON HCL 4 MG/2ML IJ SOLN
INTRAMUSCULAR | Status: DC | PRN
  Administered 2016-09-21: 4 mg via INTRAVENOUS

## 2016-09-21 MED ORDER — CEFAZOLIN SODIUM-DEXTROSE 2-4 GM/100ML-% IV SOLN
INTRAVENOUS | Status: AC
  Filled 2016-09-21: qty 100

## 2016-09-21 MED ORDER — FENTANYL CITRATE (PF) 100 MCG/2ML IJ SOLN
50.0000 ug | INTRAMUSCULAR | Status: AC | PRN
  Administered 2016-09-21: 100 ug via INTRAVENOUS
  Administered 2016-09-21 (×2): 50 ug via INTRAVENOUS

## 2016-09-21 MED ORDER — DEXAMETHASONE SODIUM PHOSPHATE 4 MG/ML IJ SOLN
INTRAMUSCULAR | Status: DC | PRN
  Administered 2016-09-21 (×2): 10 mg via INTRAVENOUS

## 2016-09-21 MED ORDER — OXYCODONE-ACETAMINOPHEN 7.5-325 MG PO TABS: 1.0000 | ORAL_TABLET | ORAL | 0 refills | Status: DC | PRN

## 2016-09-21 MED ORDER — ONDANSETRON HCL 4 MG/2ML IJ SOLN
INTRAMUSCULAR | Status: AC
  Filled 2016-09-21: qty 2

## 2016-09-21 MED ORDER — BUPIVACAINE-EPINEPHRINE (PF) 0.5% -1:200000 IJ SOLN
INTRAMUSCULAR | Status: DC | PRN
  Administered 2016-09-21: 30 mL via PERINEURAL

## 2016-09-21 MED ORDER — SCOPOLAMINE 1 MG/3DAYS TD PT72: 1.0000 | MEDICATED_PATCH | Freq: Once | TRANSDERMAL | Status: DC | PRN

## 2016-09-21 MED ORDER — LACTATED RINGERS IV SOLN
INTRAVENOUS | Status: DC
  Administered 2016-09-21: 12:00:00 via INTRAVENOUS

## 2016-09-21 MED ORDER — LIDOCAINE 2% (20 MG/ML) 5 ML SYRINGE
INTRAMUSCULAR | Status: AC
  Filled 2016-09-21: qty 5

## 2016-09-21 MED ORDER — LACTATED RINGERS IV SOLN: INTRAVENOUS | Status: DC

## 2016-09-21 MED ORDER — EPHEDRINE SULFATE 50 MG/ML IJ SOLN
INTRAMUSCULAR | Status: DC | PRN
  Administered 2016-09-21 (×2): 10 mg via INTRAVENOUS

## 2016-09-21 MED ORDER — CHLORHEXIDINE GLUCONATE 4 % EX LIQD: 60.0000 mL | Freq: Once | CUTANEOUS | Status: DC

## 2016-09-21 MED ORDER — FENTANYL CITRATE (PF) 100 MCG/2ML IJ SOLN: 25.0000 ug | INTRAMUSCULAR | Status: DC | PRN

## 2016-09-21 SURGICAL SUPPLY — 79 items
BLADE ARTHRO LOK 4 BEAVER (BLADE) IMPLANT
BLADE ARTHRO LOK 4MM BEAVER (BLADE)
BLADE MINI RND TIP GREEN BEAV (BLADE) ×3 IMPLANT
BLADE SURG 15 STRL LF DISP TIS (BLADE) ×1 IMPLANT
BLADE SURG 15 STRL SS (BLADE) ×3
BNDG CMPR 9X4 STRL LF SNTH (GAUZE/BANDAGES/DRESSINGS) ×1
BNDG COHESIVE 3X5 TAN STRL LF (GAUZE/BANDAGES/DRESSINGS) ×3 IMPLANT
BNDG ESMARK 4X9 LF (GAUZE/BANDAGES/DRESSINGS) ×3 IMPLANT
BNDG GAUZE ELAST 4 BULKY (GAUZE/BANDAGES/DRESSINGS) ×3 IMPLANT
BUR EGG 3PK/BX (BURR) IMPLANT
CAP PIN PROTECTOR ORTHO WHT (CAP) ×2 IMPLANT
CHLORAPREP W/TINT 26ML (MISCELLANEOUS) ×3 IMPLANT
CLOSURE WOUND 1/4X4 (GAUZE/BANDAGES/DRESSINGS)
CORDS BIPOLAR (ELECTRODE) ×3 IMPLANT
COVER BACK TABLE 60X90IN (DRAPES) ×3 IMPLANT
COVER MAYO STAND STRL (DRAPES) ×3 IMPLANT
CUFF TOURNIQUET SINGLE 18IN (TOURNIQUET CUFF) ×2 IMPLANT
DECANTER SPIKE VIAL GLASS SM (MISCELLANEOUS) IMPLANT
DRAPE EXTREMITY T 121X128X90 (DRAPE) ×3 IMPLANT
DRAPE OEC MINIVIEW 54X84 (DRAPES) ×3 IMPLANT
DRAPE SURG 17X23 STRL (DRAPES) ×3 IMPLANT
GAUZE SPONGE 4X4 12PLY STRL (GAUZE/BANDAGES/DRESSINGS) ×3 IMPLANT
GAUZE SPONGE 4X4 16PLY XRAY LF (GAUZE/BANDAGES/DRESSINGS) IMPLANT
GAUZE XEROFORM 1X8 LF (GAUZE/BANDAGES/DRESSINGS) ×3 IMPLANT
GLOVE BIO SURGEON STRL SZ7.5 (GLOVE) ×2 IMPLANT
GLOVE BIOGEL PI IND STRL 7.0 (GLOVE) IMPLANT
GLOVE BIOGEL PI IND STRL 8 (GLOVE) IMPLANT
GLOVE BIOGEL PI IND STRL 8.5 (GLOVE) ×1 IMPLANT
GLOVE BIOGEL PI INDICATOR 7.0 (GLOVE) ×6
GLOVE BIOGEL PI INDICATOR 8 (GLOVE) ×2
GLOVE BIOGEL PI INDICATOR 8.5 (GLOVE) ×2
GLOVE ECLIPSE 6.5 STRL STRAW (GLOVE) ×2 IMPLANT
GLOVE SURG ORTHO 8.0 STRL STRW (GLOVE) ×3 IMPLANT
GLOVE SURG SS PI 6.5 STRL IVOR (GLOVE) ×2 IMPLANT
GOWN STRL REUS W/ TWL LRG LVL3 (GOWN DISPOSABLE) ×1 IMPLANT
GOWN STRL REUS W/TWL LRG LVL3 (GOWN DISPOSABLE) ×3
GOWN STRL REUS W/TWL XL LVL3 (GOWN DISPOSABLE) ×5 IMPLANT
K-WIRE .045X4 (WIRE) ×4 IMPLANT
NDL KEITH (NEEDLE) IMPLANT
NDL PRECISIONGLIDE 27X1.5 (NEEDLE) IMPLANT
NDL SUT 6 .5 CRC .975X.05 MAYO (NEEDLE) IMPLANT
NEEDLE KEITH (NEEDLE) IMPLANT
NEEDLE MAYO TAPER (NEEDLE)
NEEDLE PRECISIONGLIDE 27X1.5 (NEEDLE) IMPLANT
NS IRRIG 1000ML POUR BTL (IV SOLUTION) ×3 IMPLANT
PACK BASIN DAY SURGERY FS (CUSTOM PROCEDURE TRAY) ×3 IMPLANT
PAD CAST 3X4 CTTN HI CHSV (CAST SUPPLIES) ×1 IMPLANT
PADDING CAST ABS 3INX4YD NS (CAST SUPPLIES)
PADDING CAST ABS 4INX4YD NS (CAST SUPPLIES)
PADDING CAST ABS COTTON 3X4 (CAST SUPPLIES) IMPLANT
PADDING CAST ABS COTTON 4X4 ST (CAST SUPPLIES) ×1 IMPLANT
PADDING CAST COTTON 3X4 STRL (CAST SUPPLIES) ×3
RUBBERBAND STERILE (MISCELLANEOUS) IMPLANT
SLEEVE SCD COMPRESS KNEE MED (MISCELLANEOUS) ×5 IMPLANT
SLING ARM FOAM STRAP LRG (SOFTGOODS) ×2 IMPLANT
SPLINT PLASTER CAST XFAST 3X15 (CAST SUPPLIES) IMPLANT
SPLINT PLASTER XTRA FASTSET 3X (CAST SUPPLIES)
STOCKINETTE 4X48 STRL (DRAPES) ×3 IMPLANT
STRIP CLOSURE SKIN 1/4X4 (GAUZE/BANDAGES/DRESSINGS) IMPLANT
SUT ETHIBOND 2 OS 4 DA (SUTURE) IMPLANT
SUT ETHIBOND 3-0 V-5 (SUTURE) IMPLANT
SUT ETHILON 4 0 PS 2 18 (SUTURE) ×3 IMPLANT
SUT FIBERWIRE #2 38 T-5 BLUE (SUTURE)
SUT FIBERWIRE 2-0 18 17.9 3/8 (SUTURE)
SUT FIBERWIRE 4-0 18 DIAM BLUE (SUTURE) ×3
SUT MERSILENE 4 0 P 3 (SUTURE) IMPLANT
SUT STEEL 3 0 (SUTURE) ×3 IMPLANT
SUT STEEL 4 0 (SUTURE) IMPLANT
SUT VIC AB 4-0 P-3 18XBRD (SUTURE) IMPLANT
SUT VIC AB 4-0 P2 18 (SUTURE) IMPLANT
SUT VIC AB 4-0 P3 18 (SUTURE) ×3
SUTURE FIBERWR #2 38 T-5 BLUE (SUTURE) IMPLANT
SUTURE FIBERWR 2-0 18 17.9 3/8 (SUTURE) IMPLANT
SUTURE FIBERWR 4-0 18 DIA BLUE (SUTURE) ×1 IMPLANT
SYR BULB 3OZ (MISCELLANEOUS) ×3 IMPLANT
SYR CONTROL 10ML LL (SYRINGE) IMPLANT
TOWEL OR 17X24 6PK STRL BLUE (TOWEL DISPOSABLE) ×4 IMPLANT
TOWEL OR NON WOVEN STRL DISP B (DISPOSABLE) ×2 IMPLANT
UNDERPAD 30X30 (UNDERPADS AND DIAPERS) ×1 IMPLANT

## 2016-09-21 NOTE — H&P (Signed)
[name] [name] is an 61 y.o. female.   Chief Complaint: pain right hand HPI: [name] [name] is a 61 year old right-hand-dominant female who sustained an injury to her right hand and ankle in a fall on approximately May 06, 2016. She was seen at [facility] urgent care and [name] [name] was taken care of by Dr. Audelia Acton for her ankle she complained of pain in her hand x-rays were done which were read out with no fracture. She was scheduled to have an MRI of her hand and wrist done but was denied by [employer]. She complains of stiffness of her finger. Pain and swelling of her hand. She has no prior history of injuries. She states certain activities cause a sharp aching pain with a VAS score of 5/10. She has been taking Tylenol for pain. Skin she has no prior history of injuries. She has a history of thyroid problem. She has no history of diabetes arthritis or gout. Family history is negative for diabetes thyroid problems arthritis and gout.She his had her CT scan done. This reveals a volar fracture dislocation of the [facility] joint. She continues to complain of moderate discomfort. She has done a good job of mobilizing her PIP joint and shows a slight flexion deformity. She has not been good job of immobilizing the metacarpal phalangeal joint and this is stiff in extension. Localized pain at the carpometacarpal joint. This is now 31 months old          Past Medical History:  Diagnosis Date  . CMC (carpometacarpal joint) dislocation 09/2016   right small finger  . Dental bridge present    x 2 - upper  . History of breast cancer   . Hypothyroidism   . Right bundle branch block (RBBB)     Past Surgical History:  Procedure Laterality Date  . BREAST LUMPECTOMY Right   . CATARACT EXTRACTION W/ INTRAOCULAR LENS  IMPLANT, BILATERAL    . PTOSIS REPAIR     eyelid    History reviewed. No pertinent family history. Social History:  reports that she has never smoked. She has never used smokeless tobacco. She  reports that she drinks alcohol. She reports that she does not use drugs.  Allergies:  Allergies  Allergen Reactions  . Peanut-Containing Drug Products Other (See Comments)    MIGRAINES  . Adhesive [Tape] Rash    No prescriptions prior to admission.    No results found for this or any previous visit (from the past 48 hour(s)).  No results found.   Pertinent items are noted in HPI.  Height 5\' 4"  (1.626 m), weight 83.9 kg (185 lb).  General appearance: alert, cooperative and appears stated age Head: Normocephalic, without obvious abnormality Neck: no JVD Resp: clear to auscultation bilaterally Cardio: regular rate and rhythm, S1, S2 normal, no murmur, click, rub or gallop GI: soft, non-tender; bowel sounds normal; no masses,  no organomegaly Extremities: pain and deformity right hand Pulses: 2+ and symmetric Skin: Skin color, texture, turgor normal. No rashes or lesions Neurologic: Grossly normal Incision/Wound: na  Assessment/Plan Assessment:  1. Right hand pain  2. CMC (carpometacarpal joint) dislocation, right   Plan: Plan we have discussed possibility of attempting to reconstruct the joint. She is advised however that this likely going to require a fusion of the carpometacarpal joint of her finger. I recommend attempting to do a joint release of her MP joint at the same time. This will be scheduled with subsequent procedure. She is also advised use of a bone  graft if necessary from her distal radius for fusion. Pre-peri-and postoperative course are discussed along with risk complications. She is where there is no guarantee to the surgery the possibility of infection recurrence injury to arteries nerves tendons complete relief symptoms and dystrophy. Scheduled for reconstruction possible fusion carpometacarpal joint right hand as an outpatient under regional anesthesia.      Tisa Weisel R 09/21/2016, 9:14 AM

## 2016-09-21 NOTE — Transfer of Care (Signed)
Immediate Anesthesia Transfer of Care Note  Patient: [name] [name]  Procedure(s) Performed: Procedure(s): RECONSTRUCTION WITH  FUSION CARPOMETACARPAL JOINT OF RIGHT SMALL FINGER WITH DISTAL RADIUS GRAFT (Right)  Patient Location: PACU  Anesthesia Type:General  Level of Consciousness: awake and sedated  Airway & Oxygen Therapy: Patient Spontanous Breathing and Patient connected to face mask oxygen  Post-op Assessment: Report given to RN and Post -op Vital signs reviewed and stable  Post vital signs: Reviewed and stable  Last Vitals:  Vitals:   09/21/16 1232 09/21/16 1233  BP:    Pulse: 68 69  Resp: 19 (!) 23  Temp:      Last Pain:  Vitals:   09/21/16 1141  TempSrc:   PainSc: 2       Patients Stated Pain Goal: 1 (79/39/68 [id number])  Complications: No apparent anesthesia complications

## 2016-09-21 NOTE — Anesthesia Procedure Notes (Addendum)
Anesthesia Regional Block: Supraclavicular block   Pre-Anesthetic Checklist: ,, timeout performed, Correct Patient, Correct Site, Correct Laterality, Correct Procedure, Correct Position, site marked, Risks and benefits discussed,  Surgical consent,  Pre-op evaluation,  At surgeon's request and post-op pain management  Laterality: Right and Upper  Prep: Maximum Sterile Barrier Precautions used, chloraprep       Needles:  Injection technique: Single-shot  Needle Type: Echogenic Stimulator Needle     Needle Length: 10cm      Additional Needles:   Procedures: ultrasound guided,,,,,,,,  Narrative:  Start time: 09/21/2016 12:17 PM End time: 09/21/2016 12:27 PM Injection made incrementally with aspirations every 5 mL.  Performed by: Personally  Anesthesiologist: [name]  Additional Notes: Risks, benefits and alternative to block explained extensively.  Patient tolerated procedure well, without complications.

## 2016-09-21 NOTE — Anesthesia Procedure Notes (Signed)
Procedure Name: LMA Insertion Performed by: Aloma Boch W Pre-anesthesia Checklist: Patient identified, Emergency Drugs available, Suction available and Patient being monitored Patient Re-evaluated:Patient Re-evaluated prior to inductionOxygen Delivery Method: Circle system utilized Preoxygenation: Pre-oxygenation with 100% oxygen Intubation Type: IV induction Ventilation: Mask ventilation without difficulty LMA: LMA inserted LMA Size: 4.0 Number of attempts: 1 Placement Confirmation: positive ETCO2 Tube secured with: Tape Dental Injury: Teeth and Oropharynx as per pre-operative assessment        

## 2016-09-21 NOTE — Anesthesia Preprocedure Evaluation (Signed)
Anesthesia Evaluation  Patient identified by MRN, date of birth, ID band Patient awake    Reviewed: Allergy & Precautions, NPO status , Patient's Chart, lab work & pertinent test results  Airway Mallampati: II  TM Distance: >3 FB Neck ROM: Full    Dental no notable dental hx.    Pulmonary neg pulmonary ROS,    Pulmonary exam normal breath sounds clear to auscultation       Cardiovascular Normal cardiovascular exam Rhythm:Regular Rate:Normal  RBBB   Neuro/Psych negative neurological ROS  negative psych ROS   GI/Hepatic negative GI ROS, Neg liver ROS,   Endo/Other  Hypothyroidism   Renal/GU negative Renal ROS  negative genitourinary   Musculoskeletal negative musculoskeletal ROS (+)   Abdominal   Peds negative pediatric ROS (+)  Hematology negative hematology ROS (+)   Anesthesia Other Findings   Reproductive/Obstetrics negative OB ROS                             Anesthesia Physical Anesthesia Plan  ASA: II  Anesthesia Plan: General   Post-op Pain Management:  Regional for Post-op pain   Induction: Intravenous  Airway Management Planned: LMA  Additional Equipment:   Intra-op Plan:   Post-operative Plan: Extubation in OR  Informed Consent: I have reviewed the patients History and Physical, chart, labs and discussed the procedure including the risks, benefits and alternatives for the proposed anesthesia with the patient or authorized representative who has indicated his/her understanding and acceptance.   Dental advisory given  Plan Discussed with: CRNA  Anesthesia Plan Comments: (SCB)        Anesthesia Quick Evaluation

## 2016-09-21 NOTE — Discharge Instructions (Addendum)

## 2016-09-21 NOTE — Op Note (Signed)
I assisted Surgeon(s) and Role:    * Daryll Brod, MD - Primary    * Leanora Cover, MD - Assisting on the Procedure(s): RECONSTRUCTION WITH  FUSION CARPOMETACARPAL JOINT OF RIGHT SMALL FINGER WITH DISTAL RADIUS GRAFT on 09/21/2016.  I provided assistance on this case as follows: retraction soft tissues, harvesting and placement of bone graft.  Electronically signed by: Tennis Must, MD Date: 09/21/2016 Time: 2:20 PM

## 2016-09-21 NOTE — Progress Notes (Signed)
Assisted Dr. Carignan with right, ultrasound guided, supraclavicular block. Side rails up, monitors on throughout procedure. See vital signs in flow sheet. Tolerated Procedure well. 

## 2016-09-21 NOTE — Op Note (Signed)
Dictation Number [id number]

## 2016-09-21 NOTE — Brief Op Note (Signed)
09/21/2016  2:18 PM  PATIENT:  [name] [name]  61 y.o. female  PRE-OPERATIVE DIAGNOSIS:  DISLOCATION CARPOMETACARPAL JOINT OF RIGHT SMALL FINGER  POST-OPERATIVE DIAGNOSIS:  DISLOCATION CARPOMETACARPAL JOINT OF RIGHT SMALL FINGER  PROCEDURE:  Procedure(s): RECONSTRUCTION WITH  FUSION CARPOMETACARPAL JOINT OF RIGHT SMALL FINGER WITH DISTAL RADIUS GRAFT (Right)  SURGEON:  Surgeon(s) and Role:    * Daryll Brod, MD - Primary    * Leanora Cover, MD - Assisting  PHYSICIAN ASSISTANT:   ASSISTANTS: K Mantaj Chamberlin,MD   ANESTHESIA:   local, regional and general  EBL:  Total I/O In: 1200 [I.V.:1200] Out: 5 [Blood:5]  BLOOD ADMINISTERED:none  DRAINS: none   LOCAL MEDICATIONS USED:  NONE  SPECIMEN:  No Specimen  DISPOSITION OF SPECIMEN:  N/A  COUNTS:  YES  TOURNIQUET:  * Missing tourniquet times found for documented tourniquets in log:  [id number] *  DICTATION: .Other Dictation: Dictation Number [id number]  PLAN OF CARE: Discharge to home after PACU  PATIENT DISPOSITION:  PACU - hemodynamically stable.

## 2016-09-22 ENCOUNTER — Encounter (HOSPITAL_BASED_OUTPATIENT_CLINIC_OR_DEPARTMENT_OTHER): Payer: Self-pay | Admitting: Orthopedic Surgery

## 2016-09-22 NOTE — Op Note (Signed)
NAME:  [name], [name]                    ACCOUNT NO.:  MEDICAL RECORD NO.:  [id number]  LOCATION:                                 FACILITY:  PHYSICIAN:  Daryll Brod, M.D.       DATE OF BIRTH:  02/18/1956 ASSISTANT: K. Charina Fons,MD DATE OF PROCEDURE:  09/21/2016 DATE OF DISCHARGE:                              OPERATIVE REPORT   PREOPERATIVE DIAGNOSIS:  Fracture dislocation carpometacarpal joint, right small finger, volar.  POSTOPERATIVE DIAGNOSIS:  Fracture dislocation carpometacarpal joint, right small finger, volar.  OPERATION:  Reduction and fusion carpometacarpal joint with distal radius bone graft, right small finger metacarpocarpal joint.  SURGEON:  Daryll Brod, M.D. ASSISTANT: K Bruce Churilla,M.D. ANESTHESIA:  Axillary block, general.  ANESTHESIOLOGIST:  [name], M.D.  HISTORY:  The patient is a 61 year old female, who suffered a fall with an injury to her hand approximately 5 months ago.  She was seen, requested to have a CT scan, which was done, denied by the insurance company.  She sustained a volar dislocation to the carpometacarpal joint of the right small finger.  She is seen with an angulated rotated small finger with an extension contracture of her metacarpophalangeal joint and a flexion contracture of her PIP joint of her small finger.  She is admitted for attempted fusion of the carpometacarpal joint of her small finger.  After reduction, she is aware that there is no guarantee to the surgery, the possibility of infection; recurrence of injury to arteries, nerves, tendons; nonhealing and dystrophy.  She is aware of the risks to the ulnar nerve.  In the preoperative area, the patient is seen, the extremity marked by both patient and surgeon.  Antibiotic given.  PROCEDURE IN DETAIL:  The patient was brought to the operating room.  A supraclavicular block was carried out in the preoperative area by Dr. Marcell Barlow.  She was prepped and draped in supine position with the  right arm free.  A general anesthetic was administered by the Anesthesia Department.  Following a 3-minute dry time, a time-out was taken confirming the patient and procedure.  The limb was exsanguinated with an Esmarch bandage.  Tourniquet placed high on the arm was inflated to 250 mmHg.  A curvilinear incision was made over the base of the fifth metacarpal at the carpometacarpal joint after localizing this with image intensification.  This was carried down through subcutaneous tissue. The dorsal sensory branch of the ulnar nerve was identified and protected.  The dissection was carried between the extensor digiti quinti and extensor carpi ulnaris tendons.  The dislocation at the carpometacarpal joint was opened with blunt and sharp dissection.  This was dissected free, taking great care on the volar surface to protect the ulnar motor branch.  Significant scarring was present on the dorsal surface between the ring metacarpal and the small metacarpal.  This was incised and excised as necessary.  The finger was finally able to be reduced.  This was grossly unstable.  It was decided to proceed with fusion of the joint.  A separate incision was then made over the distal radius Lister's tubercle.  This carried down through subcutaneous tissue.  Bleeders were again  electrocauterized.  The periosteum was then elevated off from Lister's tubercle creating a dissection, lifting the extensor carpi radialis brevis and the extensor pollicis longus off from the distal radius.  Drill holes were then placed for removal of a window to access the distal radius metaphysis.  The bone graft was then taken after the drill holes were connected with a small osteotome.  A large bone graft was harvested.  The wound was irrigated over the carpometacarpal joint and the bone graft placed.  A 0.45 K-wire was passed into the proximal aspect of the metacarpal for the ability to control the metacarpal in place.  Several  attempts were made until a good reduction was performed maintaining rotor realignment distally and returning the fifth metacarpal base into the relationship with the hamate.  A bone graft filled the void.  The position of the metacarpal on the hamate was confirmed with multiple AP, lateral, and oblique x- rays.  A second 0.45 K-wire was then inserted from the small to the ring finger.  The original K-wire was inserted down into the hamate bone and into the capitate.  This firmly stabilized the joint construct in position.  The periosteum was then closed with a running 4-0 Vicryl suture.  The subcutaneous tissue was closed with interrupted 4-0 Vicryl and the skin with interrupted 4-0 nylon sutures.  The donor site at the window replaced.  The periosteum was then closed over this with a 4-0 Vicryl sutures.  The subcutaneous tissue was closed with interrupted 4-0 Vicryl and the skin with interrupted 4-0 nylon sutures.  A sterile compressive dressing, dorsal palmar splint was placed after bending the pins and capping these, on deflation of the tourniquet, all fingers immediately pinked.  She was taken to the recovery room for observation in satisfactory condition.  She will be discharged home to return to the [facility] in 1 week, on Percocet.          ______________________________ Daryll Brod, M.D.     GK/MEDQ  D:  09/21/2016  T:  09/21/2016  Job:  [id number]

## 2016-10-02 NOTE — Anesthesia Postprocedure Evaluation (Signed)
Anesthesia Post Note  Patient: [name] [name]  Procedure(s) Performed: Procedure(s) (LRB): RECONSTRUCTION WITH  FUSION CARPOMETACARPAL JOINT OF RIGHT SMALL FINGER WITH DISTAL RADIUS GRAFT (Right)  Patient location during evaluation: PACU Anesthesia Type: General Level of consciousness: awake and alert Pain management: pain level controlled Vital Signs Assessment: post-procedure vital signs reviewed and stable Respiratory status: spontaneous breathing, nonlabored ventilation, respiratory function stable and patient connected to nasal cannula oxygen Cardiovascular status: blood pressure returned to baseline and stable Postop Assessment: no signs of nausea or vomiting Anesthetic complications: no       Last Vitals:  Vitals:   09/21/16 1445 09/21/16 1458  BP: 123/76 115/75  Pulse: 87 80  Resp: (!) 21 16  Temp:  36.8 C    Last Pain:  Vitals:   09/22/16 0923  TempSrc:   PainSc: 0-No pain                 [name]

## 2017-02-19 ENCOUNTER — Other Ambulatory Visit: Payer: Self-pay | Admitting: Orthopedic Surgery

## 2017-02-20 ENCOUNTER — Encounter (HOSPITAL_BASED_OUTPATIENT_CLINIC_OR_DEPARTMENT_OTHER): Payer: Self-pay | Admitting: *Deleted

## 2017-02-27 ENCOUNTER — Ambulatory Visit (HOSPITAL_BASED_OUTPATIENT_CLINIC_OR_DEPARTMENT_OTHER): Payer: BC Managed Care – PPO | Admitting: Anesthesiology

## 2017-02-27 ENCOUNTER — Encounter (HOSPITAL_BASED_OUTPATIENT_CLINIC_OR_DEPARTMENT_OTHER): Payer: Self-pay | Admitting: Anesthesiology

## 2017-02-27 ENCOUNTER — Ambulatory Visit (HOSPITAL_BASED_OUTPATIENT_CLINIC_OR_DEPARTMENT_OTHER)
Admission: RE | Admit: 2017-02-27 | Discharge: 2017-02-27 | Disposition: A | Discharge status: Home / Self Care | Payer: BC Managed Care – PPO | Source: Ambulatory Visit | Attending: Orthopedic Surgery | Admitting: Orthopedic Surgery

## 2017-02-27 ENCOUNTER — Encounter (HOSPITAL_BASED_OUTPATIENT_CLINIC_OR_DEPARTMENT_OTHER)
Admission: RE | Disposition: A | Discharge status: Home / Self Care | Payer: Self-pay | Source: Ambulatory Visit | Attending: Orthopedic Surgery

## 2017-02-27 DIAGNOSIS — M24541 Contracture, right hand: Secondary | ICD-10-CM | POA: Diagnosis not present

## 2017-02-27 DIAGNOSIS — Z981 Arthrodesis status: Secondary | ICD-10-CM | POA: Diagnosis not present

## 2017-02-27 DIAGNOSIS — Z79810 Long term (current) use of selective estrogen receptor modulators (SERMs): Secondary | ICD-10-CM | POA: Diagnosis not present

## 2017-02-27 DIAGNOSIS — Z853 Personal history of malignant neoplasm of breast: Secondary | ICD-10-CM | POA: Insufficient documentation

## 2017-02-27 DIAGNOSIS — Z79899 Other long term (current) drug therapy: Secondary | ICD-10-CM | POA: Insufficient documentation

## 2017-02-27 DIAGNOSIS — Z7982 Long term (current) use of aspirin: Secondary | ICD-10-CM | POA: Insufficient documentation

## 2017-02-27 DIAGNOSIS — E039 Hypothyroidism, unspecified: Secondary | ICD-10-CM | POA: Diagnosis not present

## 2017-02-27 HISTORY — PX: TENOLYSIS: SHX396

## 2017-02-27 SURGERY — INCISION, TENDON SHEATH
Anesthesia: General | Site: Hand | Laterality: Right

## 2017-02-27 MED ORDER — PROPOFOL 10 MG/ML IV BOLUS
INTRAVENOUS | Status: DC | PRN
  Administered 2017-02-27: 150 mg via INTRAVENOUS

## 2017-02-27 MED ORDER — MIDAZOLAM HCL 2 MG/2ML IJ SOLN
1.0000 mg | INTRAMUSCULAR | Status: DC | PRN
  Administered 2017-02-27: 2 mg via INTRAVENOUS

## 2017-02-27 MED ORDER — CEFAZOLIN SODIUM-DEXTROSE 2-4 GM/100ML-% IV SOLN
INTRAVENOUS | Status: AC
  Filled 2017-02-27: qty 100

## 2017-02-27 MED ORDER — DEXAMETHASONE SODIUM PHOSPHATE 10 MG/ML IJ SOLN
INTRAMUSCULAR | Status: DC | PRN
  Administered 2017-02-27: 10 mg via INTRAVENOUS

## 2017-02-27 MED ORDER — ONDANSETRON HCL 4 MG/2ML IJ SOLN: 4.0000 mg | Freq: Once | INTRAMUSCULAR | Status: DC | PRN

## 2017-02-27 MED ORDER — ONDANSETRON HCL 4 MG/2ML IJ SOLN
INTRAMUSCULAR | Status: AC
  Filled 2017-02-27: qty 2

## 2017-02-27 MED ORDER — MIDAZOLAM HCL 2 MG/2ML IJ SOLN
INTRAMUSCULAR | Status: AC
  Filled 2017-02-27: qty 2

## 2017-02-27 MED ORDER — EPHEDRINE SULFATE 50 MG/ML IJ SOLN
INTRAMUSCULAR | Status: DC | PRN
  Administered 2017-02-27 (×3): 10 mg via INTRAVENOUS

## 2017-02-27 MED ORDER — LIDOCAINE 2% (20 MG/ML) 5 ML SYRINGE
INTRAMUSCULAR | Status: AC
  Filled 2017-02-27: qty 5

## 2017-02-27 MED ORDER — FENTANYL CITRATE (PF) 100 MCG/2ML IJ SOLN: 25.0000 ug | INTRAMUSCULAR | Status: DC | PRN

## 2017-02-27 MED ORDER — PHENYLEPHRINE 40 MCG/ML (10ML) SYRINGE FOR IV PUSH (FOR BLOOD PRESSURE SUPPORT)
PREFILLED_SYRINGE | INTRAVENOUS | Status: AC
  Filled 2017-02-27: qty 10

## 2017-02-27 MED ORDER — FENTANYL CITRATE (PF) 100 MCG/2ML IJ SOLN
INTRAMUSCULAR | Status: AC
  Filled 2017-02-27: qty 2

## 2017-02-27 MED ORDER — LIDOCAINE HCL (CARDIAC) 20 MG/ML IV SOLN
INTRAVENOUS | Status: DC | PRN
  Administered 2017-02-27: 10 mg via INTRAVENOUS

## 2017-02-27 MED ORDER — CHLORHEXIDINE GLUCONATE 4 % EX LIQD: 60.0000 mL | Freq: Once | CUTANEOUS | Status: DC

## 2017-02-27 MED ORDER — EPHEDRINE 5 MG/ML INJ
INTRAVENOUS | Status: AC
  Filled 2017-02-27: qty 10

## 2017-02-27 MED ORDER — FENTANYL CITRATE (PF) 100 MCG/2ML IJ SOLN
50.0000 ug | INTRAMUSCULAR | Status: DC | PRN
  Administered 2017-02-27: 50 ug via INTRAVENOUS

## 2017-02-27 MED ORDER — MIDAZOLAM HCL 5 MG/5ML IJ SOLN
INTRAMUSCULAR | Status: DC | PRN
  Administered 2017-02-27: 2 mg via INTRAVENOUS

## 2017-02-27 MED ORDER — LACTATED RINGERS IV SOLN
INTRAVENOUS | Status: DC
  Administered 2017-02-27 (×2): via INTRAVENOUS

## 2017-02-27 MED ORDER — ONDANSETRON HCL 4 MG/2ML IJ SOLN
INTRAMUSCULAR | Status: DC | PRN
  Administered 2017-02-27: 4 mg via INTRAVENOUS

## 2017-02-27 MED ORDER — PROPOFOL 10 MG/ML IV BOLUS
INTRAVENOUS | Status: AC
  Filled 2017-02-27: qty 20

## 2017-02-27 MED ORDER — SCOPOLAMINE 1 MG/3DAYS TD PT72: 1.0000 | MEDICATED_PATCH | Freq: Once | TRANSDERMAL | Status: DC | PRN

## 2017-02-27 MED ORDER — FENTANYL CITRATE (PF) 100 MCG/2ML IJ SOLN
INTRAMUSCULAR | Status: DC | PRN
  Administered 2017-02-27: 50 ug via INTRAVENOUS
  Administered 2017-02-27 (×2): 25 ug via INTRAVENOUS

## 2017-02-27 MED ORDER — ROPIVACAINE HCL 7.5 MG/ML IJ SOLN
INTRAMUSCULAR | Status: DC | PRN
  Administered 2017-02-27: 20 mL via PERINEURAL

## 2017-02-27 MED ORDER — HYDROCODONE-ACETAMINOPHEN 5-325 MG PO TABS: 1.0000 | ORAL_TABLET | Freq: Four times a day (QID) | ORAL | 0 refills | Status: AC | PRN

## 2017-02-27 MED ORDER — CEFAZOLIN SODIUM-DEXTROSE 2-4 GM/100ML-% IV SOLN
2.0000 g | INTRAVENOUS | Status: AC
  Administered 2017-02-27: 2 g via INTRAVENOUS

## 2017-02-27 MED ORDER — PHENYLEPHRINE HCL 10 MG/ML IJ SOLN
INTRAMUSCULAR | Status: DC | PRN
  Administered 2017-02-27: 80 ug via INTRAVENOUS

## 2017-02-27 SURGICAL SUPPLY — 43 items
BLADE MINI RND TIP GREEN BEAV (BLADE) ×3 IMPLANT
BLADE SURG 15 STRL LF DISP TIS (BLADE) ×2 IMPLANT
BLADE SURG 15 STRL SS (BLADE) ×4
BNDG CMPR 9X4 STRL LF SNTH (GAUZE/BANDAGES/DRESSINGS) ×2
BNDG COHESIVE 3X5 TAN STRL LF (GAUZE/BANDAGES/DRESSINGS) ×3 IMPLANT
BNDG ESMARK 4X9 LF (GAUZE/BANDAGES/DRESSINGS) ×3 IMPLANT
BNDG GAUZE ELAST 4 BULKY (GAUZE/BANDAGES/DRESSINGS) ×4 IMPLANT
CHLORAPREP W/TINT 26ML (MISCELLANEOUS) ×4 IMPLANT
CORD BIPOLAR FORCEPS 12FT (ELECTRODE) ×3 IMPLANT
COVER BACK TABLE 60X90IN (DRAPES) ×4 IMPLANT
COVER MAYO STAND STRL (DRAPES) ×4 IMPLANT
CUFF TOURNIQUET SINGLE 18IN (TOURNIQUET CUFF) ×3 IMPLANT
DRAPE EXTREMITY T 121X128X90 (DRAPE) ×4 IMPLANT
DRAPE OEC MINIVIEW 54X84 (DRAPES) ×1 IMPLANT
DRAPE SURG 17X23 STRL (DRAPES) ×3 IMPLANT
GAUZE SPONGE 4X4 12PLY STRL (GAUZE/BANDAGES/DRESSINGS) ×4 IMPLANT
GAUZE SPONGE 4X4 16PLY XRAY LF (GAUZE/BANDAGES/DRESSINGS) IMPLANT
GAUZE XEROFORM 1X8 LF (GAUZE/BANDAGES/DRESSINGS) ×4 IMPLANT
GLOVE BIOGEL PI IND STRL 7.0 (GLOVE) ×1 IMPLANT
GLOVE BIOGEL PI IND STRL 8 (GLOVE) ×2 IMPLANT
GLOVE BIOGEL PI IND STRL 8.5 (GLOVE) ×2 IMPLANT
GLOVE BIOGEL PI INDICATOR 7.0 (GLOVE) ×2
GLOVE BIOGEL PI INDICATOR 8 (GLOVE) ×2
GLOVE BIOGEL PI INDICATOR 8.5 (GLOVE) ×2
GLOVE ECLIPSE 6.5 STRL STRAW (GLOVE) ×3 IMPLANT
GLOVE SURG ORTHO 8.0 STRL STRW (GLOVE) ×4 IMPLANT
GOWN STRL REUS W/ TWL LRG LVL3 (GOWN DISPOSABLE) ×2 IMPLANT
GOWN STRL REUS W/TWL LRG LVL3 (GOWN DISPOSABLE) ×4
GOWN STRL REUS W/TWL XL LVL3 (GOWN DISPOSABLE) ×4 IMPLANT
NDL PRECISIONGLIDE 27X1.5 (NEEDLE) IMPLANT
NEEDLE PRECISIONGLIDE 27X1.5 (NEEDLE) IMPLANT
NS IRRIG 1000ML POUR BTL (IV SOLUTION) ×3 IMPLANT
PACK BASIN DAY SURGERY FS (CUSTOM PROCEDURE TRAY) ×4 IMPLANT
PAD CAST 3X4 CTTN HI CHSV (CAST SUPPLIES) ×2 IMPLANT
PADDING CAST COTTON 3X4 STRL (CAST SUPPLIES) ×4
SLEEVE SCD COMPRESS KNEE MED (MISCELLANEOUS) ×3 IMPLANT
SPLINT PLASTER CAST XFAST 3X15 (CAST SUPPLIES) ×10 IMPLANT
SPLINT PLASTER XTRA FASTSET 3X (CAST SUPPLIES) ×20
STOCKINETTE 4X48 STRL (DRAPES) ×4 IMPLANT
SUT ETHILON 4 0 PS 2 18 (SUTURE) ×4 IMPLANT
SUT MERSILENE 5 0 P 3 (SUTURE) ×3 IMPLANT
SYR BULB 3OZ (MISCELLANEOUS) ×4 IMPLANT
SYR CONTROL 10ML LL (SYRINGE) ×1 IMPLANT

## 2017-02-27 NOTE — H&P (Signed)
[name] [name] is an 61 y.o. female.   Chief Complaint: stiff right small finger HPI: [name] [name] is a 60 year old female with a history of fracture of her [place] area of her small finger right hand. She is undergone fusion of the carpal metacarpal joint of the small finger. She was last seen on 01/19/2017. This fusion site has healed. She is left now with stiffness of the metacarpal phalangeal joint in hyperextension at the small finger. She has regained mobility of the metacarpal phalangeal joint of the ring finger although it is not full. She lacks approximately 10-15 of full flexion secondary to pain. She has regained passive mobility of the PIP joint of her small finger. She is not complaining of any pain at rest. She has a history of thyroid problems she has no history of diabetes arthritis or gout. Family history is negative for diabetes thyroid problems arthritis and gout.      Past Medical History:  Diagnosis Date  . CMC (carpometacarpal joint) dislocation 09/2016   right small finger  . Dental bridge present    x 2 - upper  . History of breast cancer   . Hypothyroidism   . Right bundle branch block (RBBB)     Past Surgical History:  Procedure Laterality Date  . BREAST LUMPECTOMY Right   . CARPOMETACARPEL ([place]) FUSION OF THUMB WITH AUTOGRAFT FROM RADIUS Right 09/21/2016   Procedure: RECONSTRUCTION WITH  FUSION CARPOMETACARPAL JOINT OF RIGHT SMALL FINGER WITH DISTAL RADIUS GRAFT;  Surgeon: Daryll Brod, MD;  Location: [place];  Service: Orthopedics;  Laterality: Right;  . CATARACT EXTRACTION W/ INTRAOCULAR LENS  IMPLANT, BILATERAL    . PTOSIS REPAIR     eyelid    History reviewed. No pertinent family history. Social History:  reports that she has never smoked. She has never used smokeless tobacco. She reports that she drinks alcohol. She reports that she does not use drugs.  Allergies:  Allergies  Allergen Reactions  . Peanut-Containing Drug Products Other (See  Comments)    MIGRAINES  . Adhesive [Tape] Rash    Medications Prior to Admission  Medication Sig Dispense Refill  . aspirin EC 81 MG tablet Take 81 mg by mouth daily.    . Calcium Carbonate-Vitamin D (CALTRATE 600+D PO) Take by mouth 2 (two) times daily.    Marland Kitchen glucosamine-chondroitin 500-400 MG tablet Take 1 tablet by mouth 2 (two) times daily.     Marland Kitchen levothyroxine (SYNTHROID, LEVOTHROID) 75 MCG tablet Take 75 mcg by mouth daily before breakfast.    . magnesium oxide (MAG-OX) 400 MG tablet Take 250 mg by mouth daily.    . Multiple Vitamin (MULTIVITAMIN) tablet Take 1 tablet by mouth daily.    . Omega-3 Fatty Acids (FISH OIL PO) Take by mouth daily.    . simvastatin (ZOCOR) 20 MG tablet Take 20 mg by mouth daily.    . tamoxifen (NOLVADEX) 20 MG tablet Take 20 mg by mouth daily.      No results found for this or any previous visit (from the past 48 hour(s)).  No results found.   Pertinent items are noted in HPI.  Height 5\' 4"  (1.626 m), weight 86.2 kg (190 lb).  General appearance: alert, cooperative and appears stated age Head: Normocephalic, without obvious abnormality Neck: no JVD Resp: clear to auscultation bilaterally Cardio: regular rate and rhythm, S1, S2 normal, no murmur, click, rub or gallop GI: soft, non-tender; bowel sounds normal; no masses,  no organomegaly Extremities: contracture right small  finger Pulses: 2+ and symmetric Skin: Skin color, texture, turgor normal. No rashes or lesions Neurologic: Grossly normal Incision/Wound: na  Assessment/Plan Assessment:  1. CMC (carpometacarpal joint) dislocation, right 2. Joint contracture   Plan: We have had discussions about joint release Tina lysis with her. I would not recommend any joint release of the ring finger metacarpal phalangeal joint. She is scheduled for Tino lysis extensor tendon release of metacarpal phalangeal joint small fingers and outpatient under regional anesthesia with probable pinning of the  joint and flexion. She is advised there is no guarantee to the surgery the possibility of infection recurrence injury to arteries nerves tendons complete relief symptoms dystrophy. Questions are encouraged and answered her satisfaction. This surgery is scheduled to her right small finger.      Sayre Witherington R 02/27/2017, 9:30 AM

## 2017-02-27 NOTE — Op Note (Signed)
NAME:  Kasson, Azaryah               ACCOUNT NO.:  [id number]  MEDICAL RECORD NO.:  [id number]  LOCATION:                                 FACILITY:  PHYSICIAN:  Daryll Brod, M.D.            DATE OF BIRTH:  DATE OF PROCEDURE:  02/27/2017 DATE OF DISCHARGE:                              OPERATIVE REPORT   PREOPERATIVE DIAGNOSIS:  Status post fusion of carpometacarpal joint of right small finger with contracture of metacarpophalangeal joint.  POSTOPERATIVE DIAGNOSIS:  Status post fusion of carpometacarpal joint of right small finger with contracture of metacarpophalangeal joint.  OPERATION:  Tenolysis of extensor tendons with dorsal capsulotomy and release of the metacarpophalangeal joint, right small finger.  SURGEON:  Daryll Brod, MD.  ANESTHESIA:  Supraclavicular block.  PLACE OF SURGERY:  [facility].  ANESTHESIOLOGIST:  Gifford Shave.  HISTORY:  The patient is a 61 year old female, who sustained a fracture dislocation to the carpometacarpal joint of her right small finger, which was not appreciated.  This healed in a malunion.  She underwent a fusion of the [facility] joint.  Prior to that fusion, she had a contracture of the metacarpophalangeal joint in full extension.  With flexion of the PIP joint, she has been able to mobilize both the ring and small finger at the PIP joint, the ring finger at the MP joint, but has been unable to mobilize the metacarpophalangeal joint to the small finger and persists with a hyperextension deformity to the small finger metacarpophalangeal joint.  She is aware that there is no guarantee to the surgery, the possibility of infection, recurrence of injury to arteries/nerves/tendons, incomplete relief of symptoms, and dystrophy. In the preoperative area, the patient is seen, the extremity marked by both patient and surgeon, antibiotic given.  DESCRIPTION OF PROCEDURE:  The patient was brought to the operating room after a supraclavicular block was  carried out without difficulty under the direction, Dr. Gifford Shave in the preoperative area.  She was prepped using ChloraPrep in a supine position with the right arm free.  A 3-minute dry time was allowed and time-out taken confirming the patient and procedure.  Curvilinear incision was made over the metacarpophalangeal joint, proximal phalanx, metacarpal of the right small finger, carried down through subcutaneous tissue.  Bleeders were electrocauterized with bipolar.  The limb had been exsanguinated with an Esmarch bandage. Tourniquet placed on the upper arm inflated to 250 mmHg.  The extensor tendon was identified.  This had a large contribution from the ring finger along with an abducted extensor digiti quinti.  I was decided to proceed with incising on the central aspect of the juncture between the 2 allowing visualization of the joint.  A tenolysis was then performed distally over the proximal phalanx and proximally over the metacarpal. This was done with blunt and sharp dissection using scissors.  A dorsal capsulotomy was then performed at the metacarpophalangeal joint.  A retractor was placed into the joint to allow visualization of the collateral ligaments, were then incised and taken off from the metacarpal head.  This had been done both radially and ulnarly.  This allowed the metacarpophalangeal joint to come into full  flexion.  The PIP joint was able be extended except for the last approximately 10 degrees.  The wound was copiously irrigated with saline.  The extensor tendon was then repaired with a running 5-0 M[place]il[place] suture.  The skin was repaired with interrupted 4-0 nylon sutures.  A sterile compressive dressing and dorsal splint to the metacarpophalangeal joints of the middle, ring, and small fingers was applied.  On deflation of the tourniquet, all fingers immediately pinked.  She was taken to the recovery room for observation in satisfactory condition.  She will  be discharged to home to return to [place] in 1 week, on [place].          ______________________________ Daryll Brod, M.D.     GK/MEDQ  D:  02/27/2017  T:  02/27/2017  Job:  [id number]

## 2017-02-27 NOTE — Anesthesia Postprocedure Evaluation (Signed)
Anesthesia Post Note  Patient: [name] [name]  Procedure(s) Performed: Procedure(s) (LRB): LYSIS EXTENSOR MCP JOINT RELEASE RIGHT SMALL FINGER (Right)     Patient location during evaluation: PACU Anesthesia Type: General Level of consciousness: awake and alert Pain management: pain level controlled Vital Signs Assessment: post-procedure vital signs reviewed and stable Respiratory status: spontaneous breathing, nonlabored ventilation and respiratory function stable Cardiovascular status: blood pressure returned to baseline and stable Postop Assessment: no apparent nausea or vomiting Anesthetic complications: no    Last Vitals:  Vitals:   02/27/17 1154 02/27/17 1205  BP: 120/73   Pulse: 85   Resp: 16   Temp: (!) 36.4 C   SpO2: 93% 97%    Last Pain:  Vitals:   02/27/17 1154  TempSrc:   PainSc: 0-No pain                 Catalina Gravel

## 2017-02-27 NOTE — Progress Notes (Signed)
Assisted Dr. Turk with right, ultrasound guided, supraclavicular block. Side rails up, monitors on throughout procedure. See vital signs in flow sheet. Tolerated Procedure well. 

## 2017-02-27 NOTE — Brief Op Note (Signed)
02/27/2017  11:15 AM  PATIENT:  [name] [name]  61 y.o. female  PRE-OPERATIVE DIAGNOSIS:  CONTRACTURE RIGHT SMALL FINGER  POST-OPERATIVE DIAGNOSIS:  CONTRACTURE RIGHT SMALL FINGER  PROCEDURE:  Procedure(s): LYSIS EXTENSOR MCP JOINT RELEASE RIGHT SMALL FINGER (Right)  SURGEON:  Surgeon(s) and Role:    * Daryll Brod, MD - Primary  PHYSICIAN ASSISTANT:   ASSISTANTS: none   ANESTHESIA:   local and regional  EBL:  Total I/O In: 1000 [I.V.:1000] Out: 2 [Blood:2]  BLOOD ADMINISTERED:none  DRAINS: none   LOCAL MEDICATIONS USED:  NONE  SPECIMEN:  No Specimen  DISPOSITION OF SPECIMEN:  N/A  COUNTS:  YES  TOURNIQUET:   Total Tourniquet Time Documented: Upper Arm (Left) - 22 minutes Total: Upper Arm (Left) - 22 minutes   DICTATION: .Other Dictation: Dictation Number [id number]  PLAN OF CARE: Discharge to home after PACU  PATIENT DISPOSITION:  PACU - hemodynamically stable.

## 2017-02-27 NOTE — Transfer of Care (Signed)
Immediate Anesthesia Transfer of Care Note  Patient: [name] [name]  Procedure(s) Performed: Procedure(s): LYSIS EXTENSOR MCP JOINT RELEASE RIGHT SMALL FINGER (Right)  Patient Location: PACU  Anesthesia Type:General and GA combined with regional for post-op pain  Level of Consciousness: awake and pateint uncooperative  Airway & Oxygen Therapy: Patient Spontanous Breathing and Patient connected to face mask oxygen  Post-op Assessment: Report given to RN and Post -op Vital signs reviewed and stable  Post vital signs: Reviewed and stable  Last Vitals:  Vitals:   02/27/17 1015 02/27/17 1020  BP: 110/74 104/69  Pulse: 81 71  Resp: 15 15  Temp:    SpO2: 100% 98%    Last Pain:  Vitals:   02/27/17 0935  TempSrc: Oral         Complications: No apparent anesthesia complications

## 2017-02-27 NOTE — Anesthesia Procedure Notes (Signed)
Anesthesia Regional Block: Supraclavicular block   Pre-Anesthetic Checklist: ,, timeout performed, Correct Patient, Correct Site, Correct Laterality, Correct Procedure, Correct Position, site marked, Risks and benefits discussed,  Surgical consent,  Pre-op evaluation,  At surgeon's request and post-op pain management  Laterality: Right  Prep: chloraprep       Needles:  Injection technique: Single-shot  Needle Type: Echogenic Needle     Needle Length: 9cm  Needle Gauge: 21     Additional Needles:   Procedures:,,,, ultrasound used (permanent image in chart),,,,  Narrative:  Start time: 02/27/2017 10:10 AM End time: 02/27/2017 10:15 AM Injection made incrementally with aspirations every 5 mL.  Performed by: Personally  Anesthesiologist: Catalina Gravel  Additional Notes: No pain on injection. No increased resistance to injection. Injection made in 5cc increments.  Good needle visualization.  Patient tolerated procedure well.

## 2017-02-27 NOTE — Anesthesia Procedure Notes (Signed)
Procedure Name: LMA Insertion Date/Time: 02/27/2017 10:35 AM Performed by: Toula Moos L Pre-anesthesia Checklist: Patient identified, Emergency Drugs available, Suction available, Patient being monitored and Timeout performed Patient Re-evaluated:Patient Re-evaluated prior to induction Oxygen Delivery Method: Circle system utilized Preoxygenation: Pre-oxygenation with 100% oxygen Induction Type: IV induction Ventilation: Mask ventilation without difficulty LMA: LMA inserted LMA Size: 4.0 Number of attempts: 1 Airway Equipment and Method: Bite block Placement Confirmation: positive ETCO2 Tube secured with: Tape Dental Injury: Teeth and Oropharynx as per pre-operative assessment

## 2017-02-27 NOTE — Discharge Instructions (Addendum)

## 2017-02-27 NOTE — Op Note (Signed)
Dictation Number [id number]

## 2017-02-27 NOTE — Anesthesia Preprocedure Evaluation (Signed)
Anesthesia Evaluation  Patient identified by MRN, date of birth, ID band Patient awake    Reviewed: Allergy & Precautions, NPO status , Patient's Chart, lab work & pertinent test results  Airway Mallampati: II  TM Distance: >3 FB Neck ROM: Full    Dental  (+) Teeth Intact, Dental Advisory Given 2 upper permanent bridges :   Pulmonary neg pulmonary ROS,    Pulmonary exam normal breath sounds clear to auscultation       Cardiovascular Normal cardiovascular exam+ dysrhythmias (RBBB)  Rhythm:Regular Rate:Normal     Neuro/Psych negative neurological ROS  negative psych ROS   GI/Hepatic negative GI ROS, Neg liver ROS,   Endo/Other  Hypothyroidism Obesity   Renal/GU negative Renal ROS     Musculoskeletal negative musculoskeletal ROS (+)   Abdominal   Peds  Hematology negative hematology ROS (+)   Anesthesia Other Findings Day of surgery medications reviewed with the patient.  Reproductive/Obstetrics                             Anesthesia Physical Anesthesia Plan  ASA: II  Anesthesia Plan: General   Post-op Pain Management:  Regional for Post-op pain   Induction: Intravenous  PONV Risk Score and Plan: 3 and Ondansetron, Dexamethasone and Midazolam  Airway Management Planned: LMA  Additional Equipment:   Intra-op Plan:   Post-operative Plan: Extubation in OR  Informed Consent: I have reviewed the patients History and Physical, chart, labs and discussed the procedure including the risks, benefits and alternatives for the proposed anesthesia with the patient or authorized representative who has indicated his/her understanding and acceptance.   Dental advisory given  Plan Discussed with: CRNA  Anesthesia Plan Comments: (Risks/benefits of general anesthesia discussed with patient including risk of damage to teeth, lips, gum, and tongue, nausea/vomiting, allergic reactions to  medications, and the possibility of heart attack, stroke and death.  All patient questions answered.  Patient wishes to proceed.)        Anesthesia Quick Evaluation

## 2017-02-28 NOTE — Addendum Note (Signed)
Addendum  created 02/28/17 0757 by Tawni Millers, CRNA   Charge Capture section accepted

## 2017-03-01 ENCOUNTER — Encounter (HOSPITAL_BASED_OUTPATIENT_CLINIC_OR_DEPARTMENT_OTHER): Payer: Self-pay | Admitting: Orthopedic Surgery

## 2017-05-26 IMAGING — DX DG HAND COMPLETE 3+V*R*
3 series · 3 of 3 positions shown · non-contrast
Comparison: May 06, 2016

CLINICAL DATA: Pain following fall

EXAM:
RIGHT HAND - COMPLETE 3+ VIEW

[hand pa]
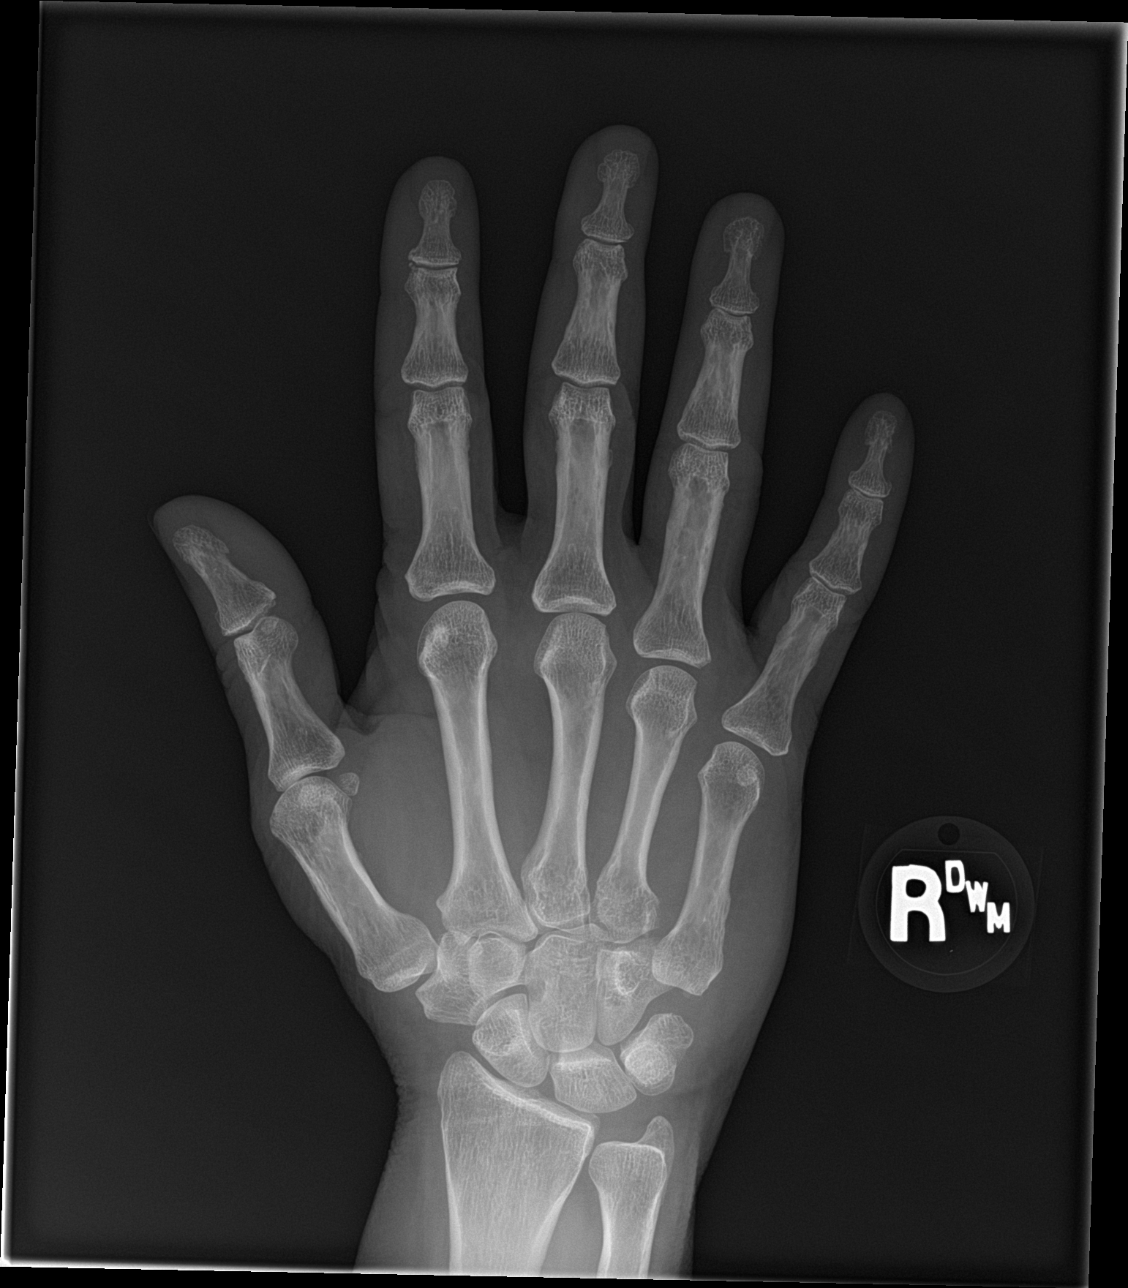

[hand obl]
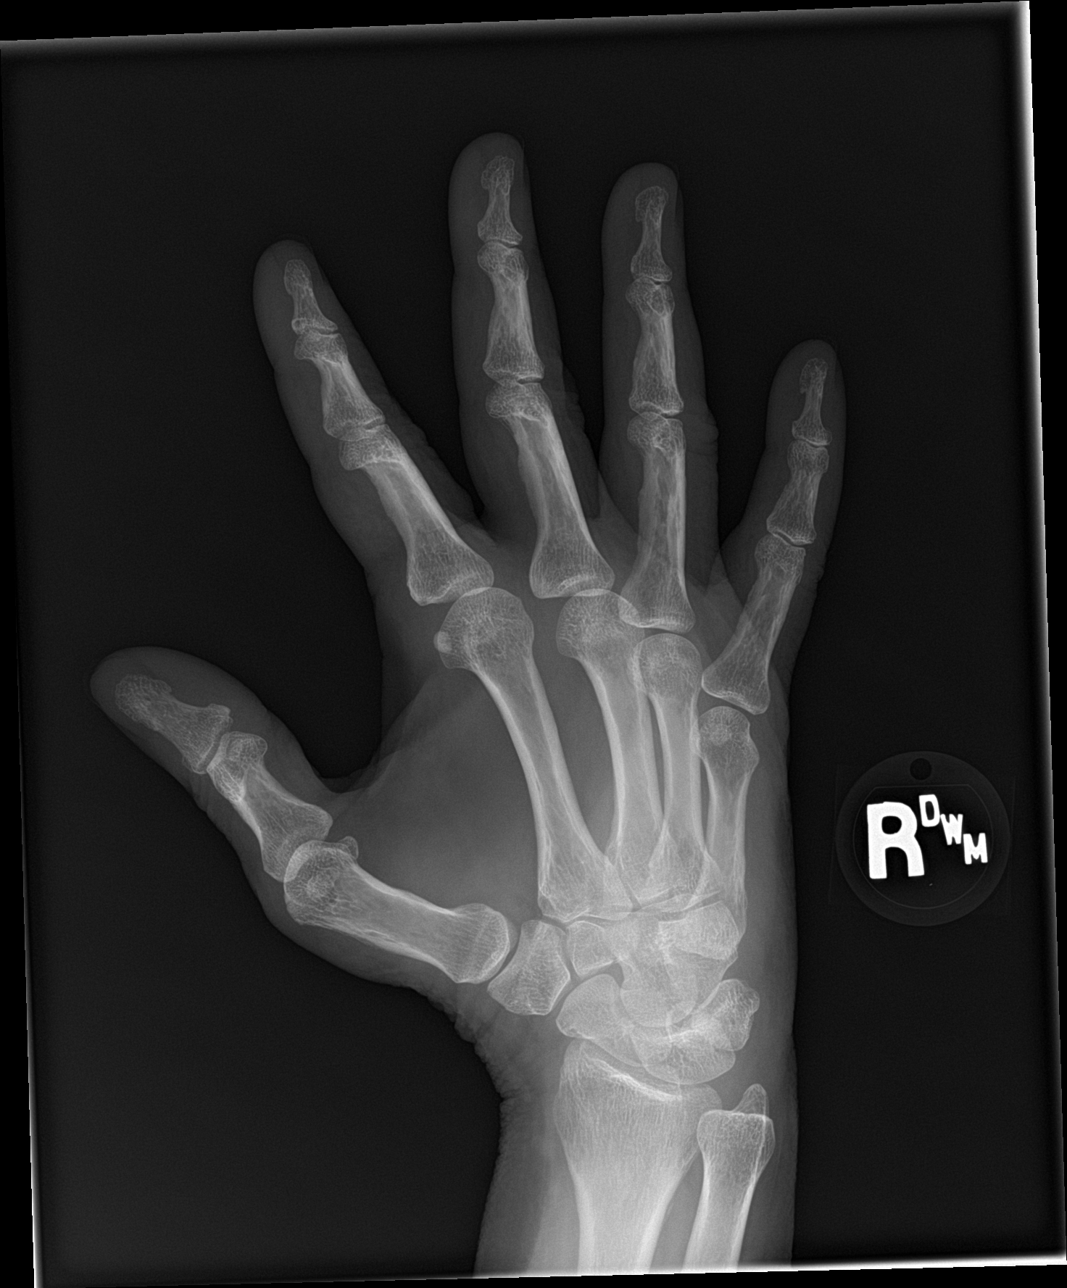

[hand lat]
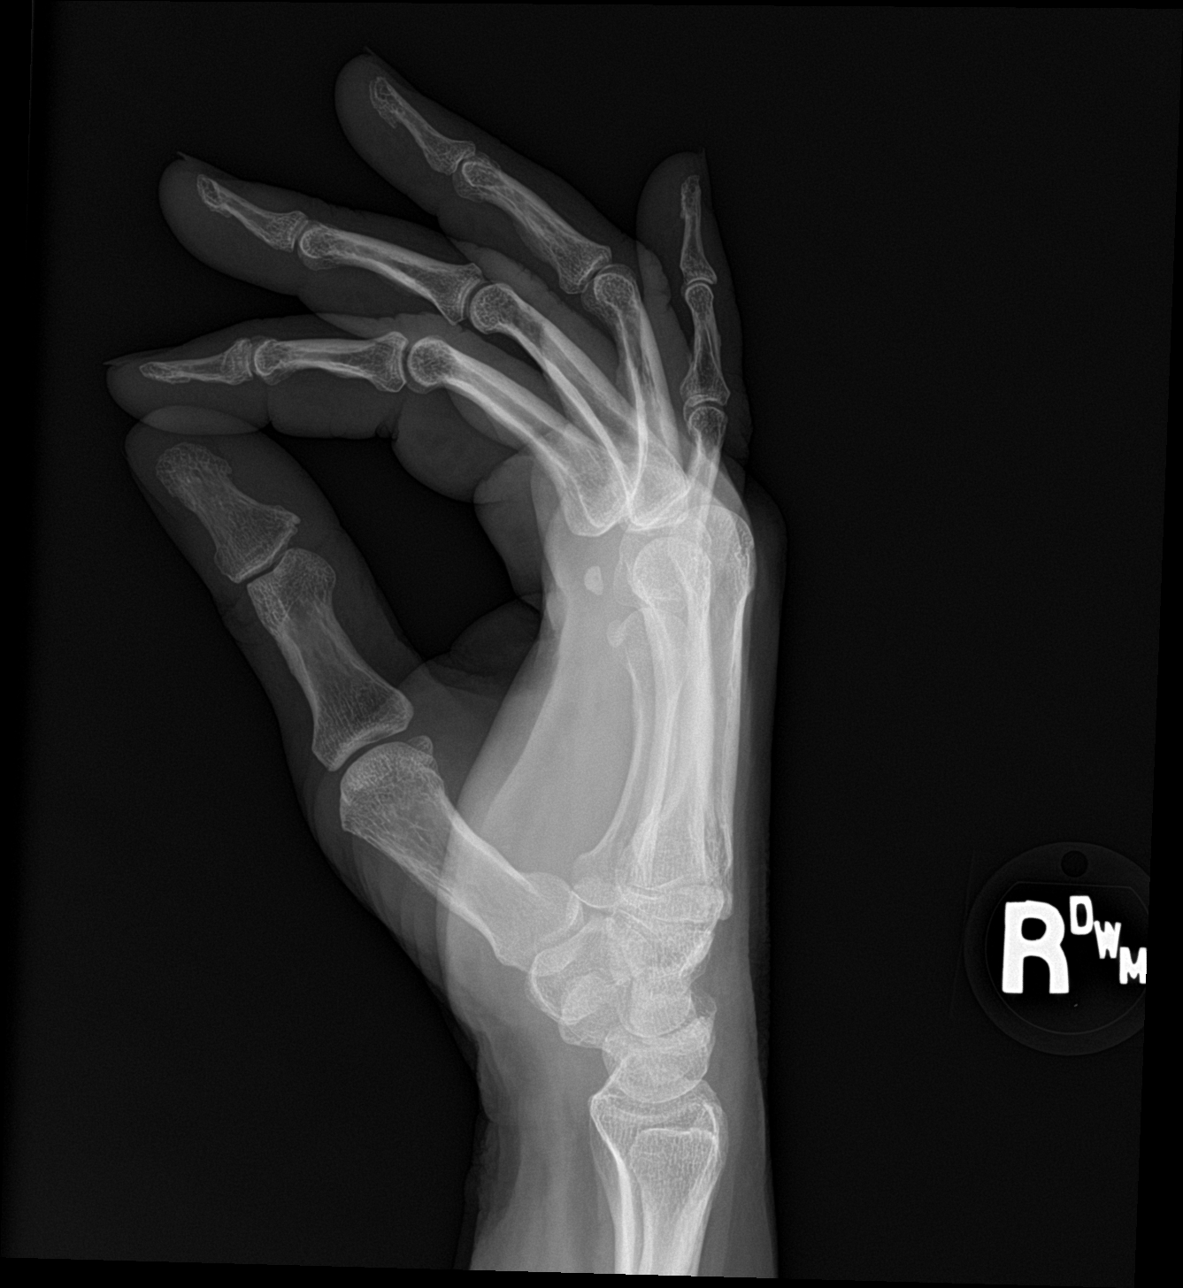

[3 of 3 positions shown; findings below may reference images not displayed]

FINDINGS: Frontal, oblique, and lateral views were obtained. There is no
evident acute fracture or dislocation. The joint spaces appear
normal. No erosive change. No soft tissue lesion evident.
IMPRESSION: No fracture or dislocation.  No evident arthropathy.

## 2020-11-15 ENCOUNTER — Other Ambulatory Visit: Payer: Self-pay | Admitting: Family Medicine

## 2020-11-15 DIAGNOSIS — R0989 Other specified symptoms and signs involving the circulatory and respiratory systems: Secondary | ICD-10-CM

## 2020-12-02 ENCOUNTER — Other Ambulatory Visit: Payer: BC Managed Care – PPO
